# Patient Record
Sex: Female | Born: 1970 | Race: Black or African American | Hispanic: No | Marital: Single | Smoking: Never smoker
Health system: Southern US, Community
[De-identification: ages and names within clinical notes are randomized; demographics above are authoritative.]

## PROBLEM LIST (undated history)

## (undated) DIAGNOSIS — K219 Gastro-esophageal reflux disease without esophagitis: Secondary | ICD-10-CM

## (undated) DIAGNOSIS — R350 Frequency of micturition: Secondary | ICD-10-CM

## (undated) DIAGNOSIS — G610 Guillain-Barre syndrome: Secondary | ICD-10-CM

## (undated) DIAGNOSIS — K7581 Nonalcoholic steatohepatitis (NASH): Secondary | ICD-10-CM

## (undated) DIAGNOSIS — K589 Irritable bowel syndrome without diarrhea: Secondary | ICD-10-CM

## (undated) DIAGNOSIS — E785 Hyperlipidemia, unspecified: Secondary | ICD-10-CM

## (undated) DIAGNOSIS — IMO0002 Reserved for concepts with insufficient information to code with codable children: Secondary | ICD-10-CM

## (undated) DIAGNOSIS — E669 Obesity, unspecified: Secondary | ICD-10-CM

## (undated) DIAGNOSIS — F410 Panic disorder [episodic paroxysmal anxiety] without agoraphobia: Secondary | ICD-10-CM

## (undated) DIAGNOSIS — K5792 Diverticulitis of intestine, part unspecified, without perforation or abscess without bleeding: Secondary | ICD-10-CM

## (undated) DIAGNOSIS — G43709 Chronic migraine without aura, not intractable, without status migrainosus: Secondary | ICD-10-CM

## (undated) DIAGNOSIS — M1990 Unspecified osteoarthritis, unspecified site: Secondary | ICD-10-CM

## (undated) DIAGNOSIS — G43909 Migraine, unspecified, not intractable, without status migrainosus: Secondary | ICD-10-CM

## (undated) DIAGNOSIS — K760 Fatty (change of) liver, not elsewhere classified: Secondary | ICD-10-CM

## (undated) DIAGNOSIS — E119 Type 2 diabetes mellitus without complications: Secondary | ICD-10-CM

## (undated) DIAGNOSIS — R209 Unspecified disturbances of skin sensation: Principal | ICD-10-CM

## (undated) DIAGNOSIS — F329 Major depressive disorder, single episode, unspecified: Secondary | ICD-10-CM

## (undated) HISTORY — DX: Hyperlipidemia, unspecified: E78.5

## (undated) HISTORY — DX: Reserved for concepts with insufficient information to code with codable children: IMO0002

## (undated) HISTORY — DX: Fatty (change of) liver, not elsewhere classified: K76.0

## (undated) HISTORY — DX: Major depressive disorder, single episode, unspecified: F32.9

## (undated) HISTORY — DX: Irritable bowel syndrome, unspecified: K58.9

## (undated) HISTORY — DX: Guillain-Barre syndrome: G61.0

## (undated) HISTORY — DX: Obesity, unspecified: E66.9

## (undated) HISTORY — DX: Gastro-esophageal reflux disease without esophagitis: K21.9

## (undated) HISTORY — DX: Irritable bowel syndrome without diarrhea: K58.9

## (undated) HISTORY — DX: Migraine, unspecified, not intractable, without status migrainosus: G43.909

## (undated) HISTORY — DX: Type 2 diabetes mellitus without complications: E11.9

## (undated) HISTORY — DX: Unspecified disturbances of skin sensation: R20.9

## (undated) HISTORY — DX: Diverticulitis of intestine, part unspecified, without perforation or abscess without bleeding: K57.92

## (undated) HISTORY — DX: Chronic migraine without aura, not intractable, without status migrainosus: G43.709

## (undated) HISTORY — DX: Nonalcoholic steatohepatitis (NASH): K75.81

---

## 1993-09-03 DIAGNOSIS — G610 Guillain-Barre syndrome: Secondary | ICD-10-CM

## 1993-09-03 HISTORY — DX: Guillain-Barre syndrome: G61.0

## 1995-09-04 HISTORY — PX: ABDOMINAL HYSTERECTOMY: SHX81

## 1998-10-24 ENCOUNTER — Ambulatory Visit (HOSPITAL_COMMUNITY): Admission: RE | Admit: 1998-10-24 | Discharge: 1998-10-24 | Payer: Self-pay | Admitting: Obstetrics and Gynecology

## 1999-03-31 ENCOUNTER — Ambulatory Visit (HOSPITAL_COMMUNITY): Admission: RE | Admit: 1999-03-31 | Discharge: 1999-03-31 | Payer: Self-pay | Admitting: Urology

## 1999-10-11 ENCOUNTER — Other Ambulatory Visit: Admission: RE | Admit: 1999-10-11 | Discharge: 1999-10-11 | Payer: Self-pay | Admitting: Obstetrics and Gynecology

## 2000-10-23 ENCOUNTER — Other Ambulatory Visit: Admission: RE | Admit: 2000-10-23 | Discharge: 2000-10-23 | Payer: Self-pay | Admitting: *Deleted

## 2001-04-07 ENCOUNTER — Encounter (INDEPENDENT_AMBULATORY_CARE_PROVIDER_SITE_OTHER): Payer: Self-pay | Admitting: Specialist

## 2001-04-07 ENCOUNTER — Observation Stay (HOSPITAL_COMMUNITY): Admission: RE | Admit: 2001-04-07 | Discharge: 2001-04-08 | Payer: Self-pay | Admitting: Obstetrics and Gynecology

## 2005-09-03 HISTORY — PX: CHOLECYSTECTOMY: SHX55

## 2009-06-08 ENCOUNTER — Emergency Department (HOSPITAL_COMMUNITY): Admission: EM | Admit: 2009-06-08 | Discharge: 2009-06-08 | Payer: Self-pay | Admitting: Emergency Medicine

## 2009-09-14 ENCOUNTER — Emergency Department (HOSPITAL_COMMUNITY): Admission: EM | Admit: 2009-09-14 | Discharge: 2009-09-14 | Payer: Self-pay | Admitting: Emergency Medicine

## 2009-10-21 ENCOUNTER — Encounter: Admission: RE | Admit: 2009-10-21 | Discharge: 2009-10-21 | Payer: Self-pay | Admitting: Family Medicine

## 2010-09-23 ENCOUNTER — Other Ambulatory Visit: Payer: Self-pay | Admitting: Family Medicine

## 2010-09-23 DIAGNOSIS — Z1239 Encounter for other screening for malignant neoplasm of breast: Secondary | ICD-10-CM

## 2010-10-27 ENCOUNTER — Ambulatory Visit: Payer: Self-pay

## 2010-11-03 ENCOUNTER — Ambulatory Visit
Admission: RE | Admit: 2010-11-03 | Discharge: 2010-11-03 | Disposition: A | Discharge status: Home / Self Care | Payer: Managed Care, Other (non HMO) | Source: Ambulatory Visit | Attending: Family Medicine | Admitting: Family Medicine

## 2010-11-03 DIAGNOSIS — Z1239 Encounter for other screening for malignant neoplasm of breast: Secondary | ICD-10-CM

## 2010-12-07 LAB — PROTIME-INR
INR: 0.97 (ref 0.00–1.49)
Prothrombin Time: 12.8 seconds (ref 11.6–15.2)

## 2010-12-07 LAB — POCT I-STAT, CHEM 8
BUN: 15 mg/dL (ref 6–23)
Calcium, Ion: 1.21 mmol/L (ref 1.12–1.32)
Chloride: 105 mEq/L (ref 96–112)
Creatinine, Ser: 0.5 mg/dL (ref 0.4–1.2)
Glucose, Bld: 159 mg/dL — ABNORMAL HIGH (ref 70–99)
HCT: 41 % (ref 36.0–46.0)
Hemoglobin: 13.9 g/dL (ref 12.0–15.0)
Potassium: 3.2 mEq/L — ABNORMAL LOW (ref 3.5–5.1)
Sodium: 142 mEq/L (ref 135–145)
TCO2: 27 mmol/L (ref 0–100)

## 2010-12-07 LAB — DIFFERENTIAL
Basophils Absolute: 0 10*3/uL (ref 0.0–0.1)
Basophils Relative: 1 % (ref 0–1)
Eosinophils Absolute: 0.3 10*3/uL (ref 0.0–0.7)
Eosinophils Relative: 4 % (ref 0–5)
Lymphocytes Relative: 37 % (ref 12–46)
Lymphs Abs: 2.6 10*3/uL (ref 0.7–4.0)
Monocytes Absolute: 0.5 10*3/uL (ref 0.1–1.0)
Monocytes Relative: 8 % (ref 3–12)
Neutro Abs: 3.5 10*3/uL (ref 1.7–7.7)
Neutrophils Relative %: 50 % (ref 43–77)

## 2010-12-07 LAB — CBC
HCT: 38.5 % (ref 36.0–46.0)
Hemoglobin: 13.1 g/dL (ref 12.0–15.0)
MCHC: 34 g/dL (ref 30.0–36.0)
MCV: 86.7 fL (ref 78.0–100.0)
Platelets: 256 10*3/uL (ref 150–400)
RBC: 4.44 MIL/uL (ref 3.87–5.11)
RDW: 13.8 % (ref 11.5–15.5)
WBC: 7 10*3/uL (ref 4.0–10.5)

## 2010-12-07 LAB — POCT CARDIAC MARKERS
CKMB, poc: <1 ng/mL — ABNORMAL LOW (ref 1.0–8.0)
Myoglobin, poc: 52.8 ng/mL (ref 12–200)
Troponin i, poc: <0.05 ng/mL (ref 0.00–0.09)

## 2011-01-19 NOTE — H&P (Signed)
[facility]  Patient:    Dana Salazar, Dana Salazar                       MRN: [id number] Adm. Date:  04/07/01 Attending:  Nena Jordan A. Cherly Hensen, M.D.                         History and Physical  CHIEF COMPLAINT:              Severe dysmenorrhea/pelvic pain, uterine fibroids.  HISTORY OF PRESENT ILLNESS:   This is a 40 year old, gravida 1, para 0-0-1-0, single, black female on Lupron Depot therapy who has been admitted for LAVH/BSO secondary to severe dysmenorrhea unresponsive to hormonal therapy. The patient is known to have uterine fibroids by ultrasound. She has also has documented pelvic endometriosis by laparoscopy done in February of 2000. The patient has been placed on Lupron Depot therapy x two courses without marked improvement in her symptoms. She has also used birth control pills on a continuous basis in the past without change in her symptomatology. The patient has also had dyspareunia. She has used nonsteroidals as well as narcotics, which have also not helped with her pain as well. The patient wishes to proceed with definitive management of her symptoms. She has had some breakthrough bleeding despite the Lupron Depot therapy. Ultrasound done on October of 2001 revealed multiple small fibroids. The ovaries were normal at that time.  PAST MEDICAL HISTORY:  ALLERGIES:                    SULFA and FLAGYL.  MEDICAL HISTORY:              Pelvic endometriosis, uterine fibroids, bladder instability since age 5.  SURGICAL HISTORY:             Diagnostic laparoscopy with peritoneal biopsies on October 24, 1998. A wrist fracture in May of 2000. TAB by review of records in 1996.  OBSTETRICAL HISTORY:          Elective termination at six weeks in April of 1996.  FAMILY HISTORY:               Father died of an MI at age 36. Maternal grandmother also died of an MI. No breast, colon, or genital cancers.  REVIEW OF SYSTEMS:            History of  hypercholesterolemia followed by Dr. Demetrius Revel. Urinary tract infection November of 2001.  SOCIAL HISTORY:               Single in a relationship, nonsmoker.  PHYSICAL EXAMINATION:  GENERAL:                      Well-developed, well-nourished black female who is in some mild discomfort.  VITAL SIGNS:                  Blood pressure 120/80. Weight 198.4 pounds.  SKIN:                         No lesions.  HEENT:                        Anicteric sclerae, pink conjunctivae, oropharynx negative.  HEART:  Regular rate and rhythm without murmur.  LUNGS:                        Clear to auscultation.  BREASTS:                      Soft, nontender, no palpable mass.  ABDOMEN:                      Soft, nondistended. No organomegaly. Healed infraumbilical scar.  PELVIC:                       Vulva show no lesion. Vagina had no discharge. Cervix was closed, no lesions.  BIMANUAL EXAMINATION:         Uterus is anteverted, slightly enlarged, tender. Adnexa tender without any palpable mass.  RECTAL EXAMINATION:           Deferred.  IMPRESSION:                   Pelvic pain/severe dysmenorrhea recalcitrant to hormonal therapy, probably caused by both the uterine fibroids and pelvic endometriosis, and possible adenomyosis. The patient does not desire future fertility.  PLAN:                         LAVH/BSO. Antibiotics prophylaxis. Antiembolic stockings. Risks of the procedure have been discussed with the patient including, but not limited to, infection, bleeding, injury to surrounding organ structures such as the bladder, bowel, ureter, internal scar tissue which in the future may cause pelvic pain and a bowel obstruction, fistula formation. Loss of future childbearing option discussed. Menopausal issues discussed. Possible inability to complete the procedure as a vaginal hysterectomy also discussed. Bleeding which may require blood transfusion, risk of transfusion,  including acute reaction, HIV transmission of 1 out of 100,000, hepatitis transmission 1 out of 3000. Postoperative care and criteria for discharge was discussed. Estrogen replacement therapy, based on the patients age, was also discussed. The patient is in agreement with the use of estrogen replacement therapy for the protective benefits. All questions answered. DD:  04/07/01 TD:  04/07/01 Job: [id number]

## 2011-01-19 NOTE — Op Note (Signed)
[facility]  Patient:    Dana Salazar, Dana Salazar                     MRN: [id number] Proc. Date: 04/07/01 Adm. Date:  [id number] Attending:  Maxie Better                           Operative Report  PREOPERATIVE DIAGNOSES:       1. Severe dysmenorrhea.                               2. Pelvic endometriosis.                               3. Uterine fibroids.  POSTOPERATIVE DIAGNOSES:      1. Severe dysmenorrhea.                               2. Pelvic endometriosis.                               3. Uterine fibroids.  PROCEDURE:                    1. Laparoscopic assisted vaginal                                  hysterectomy.                               2. Bilateral salpingo-oophorectomy.  SURGEON:                      Sheronette A. Cherly Hensen, M.D.  ASSISTANT:                    Sung Amabile. Roslyn Smiling, M.D.  ANESTHESIA:                   General.  DESCRIPTION OF PROCEDURE:     Under adequate general anesthesia, the patient was placed in the dorsal lithotomy position. Examination under anesthesia revealed an axial small uterus. No adnexal masses could be appreciated. The patient was sterilely prepped and draped in usual fashion appropriate for a a laparoscopic assisted procedure. Bivalve speculum was placed in the vagina. Single-tooth tenaculum was placed on the anterior lip of the cervix. A Kohn cannula was introduced into the cervical os and attached to the tenaculum for manipulation of the uterus. An indwelling Foley catheter was sterilely placed. The bivalve speculum was removed. Attention was then turned to the abdomen where a small infraumbilical incision was then made. Veress needle was introduced and tested with normal saline. Opening pressure of 3 was noted. Two and a half liters of CO2 was insufflated. The Veress needle was removed and a 10/11 mm disposable trocar was introduced into the abdominal cavity without incident. A lighted video  laparoscope was placed through this port. Two lower port 5 mm incisions were then made midway between the symphysis pubis and the umbilicus in both lower quadrants and under direct visualization, 5 mm ports were introduced. Panoramic inspection of the pelvis was notable for  normal liver edge, uterus that was small with subserosal fibroids, normal tubes and ovaries were noted bilaterally. Endometriotic implants were not clearly seen; however, the patient has been on Lupron Depot therapy for six months. The vesicouterine peritoneum was then opened transversely. A small bleeder was noted and a grasper then used to tent the bladder flap, and using the reticulator, the bladder flap was opened transversely and sharply dissected off the lower uterine segment. The right round ligament was grasped and cauterization with the tripolar was initiated; however, the instrument was not fully cauterizing the round ligament. The Kleppinger was then used to cauterize the round ligament. Different instrument and replacement of the tripolar was done, as well as the generator was also replaced; however, the tripolar instruments were not functioning. Both round ligaments were subsequently grasped and cauterized using Kleppingers. The round ligament was subsequently severed bilaterally. Peristalsis was looked for with respect to the ureters bilaterally; however, they were not clearly seen initially. Nonetheless, the ovarian vessels were subsequently cauterized and cut bilaterally taking care to stay close to the ovary. When both tube and ovarian attachments had been severed and the level of the uterine vessels was reached, the decision was then made to turn to the vagina.  The posterior cul-de-sac was inspected. No endometriotic implants were noted clearly. Good hemostasis was noted. The pelvis was irrigated, small bleeders cauterized in the anterior cul-de-sac. The abdomen was deflated. The instruments and ports  remained through the incisions and attention was then turned to the vagina.  The instruments in the vagina were removed. A weighted speculum was placed in the vagina. The anteroposterior lip of the cervix was grasped with [place] tenaculum, and 1% lidocaine with 100,000 dilution of epinephrine was injected circumferentially at the cervicovaginal junction. A circumferential incision was then made at this junction, and using Mayo scissors, the posterior cul-de-sac was subsequently opened and this incision was extended transversely. The uterosacral ligaments were then bilaterally clamped, cut, and suture ligated with 0 Vicryl suture. The uterine vessels were then bilaterally clamped, cut, and suture ligated with 0 Vicryl sutures. Anteriorly, the bladder was then dissected off the uterus and subsequently the anterior cul-de-sac was entered. Continual clamping of the utero-ovarian ligaments resulted in the uterus being subsequently removed. The pedicles were then free tied with 0 Vicryl sutures. Both ovaries and tubes were in place, and they were brought down into the field, and their attachment to the lateral wall were clamped, cut, and free tied with 0 Vicryl x 2 bilaterally with subsequent removal of both tubes and ovaries. Small bleeding was noted on the left posterior aspect of the vaginal cuff and this was cauterized. The posterior cul-de-sac was ______ in order to prevent enterocele formation. The vagina was then closed in a vertical fashion using interrupted 0 Vicryl sutures and the uterosacral ligaments were then tied at the midline prior to closing the vagina entirely. The patient had decreased urine output, and the patient was given a fluid bolus.  Attention was then turned to the abdomen where the abdomen was re-insufflated. The lighted video laparoscope was placed. The Nezhat suction apparatus was in place. The pelvis was irrigated. Good hemostasis was noted. The ureters  were then looked at in their location and there was noted to be peristalsis bilaterally. The bladder was filled retrograde and no evidence of any defects in the bladder. With good hemostasis noted, the abdomen was deflated and the  lower ports removed prior to that, and the infraumbilical port was then removed.  The incision was then injected with 0.25% Marcaine and closed with 4-0 Vicryl sutures.  SPECIMENS:                    Uterus with cervix, tubes and ovaries bilaterally.  ESTIMATED BLOOD LOSS:         50 cc.  URINE OUTPUT:                 250 cc clear yellow urine.  INTRAOPERATIVE FLUID:         2800 cc.  COUNTS:                       Sponge and instrument count x 2 was correct.  COMPLICATIONS:                None.  DISPOSITION:                  The patient tolerated the procedure well and was transferred to recovery room in stable condition. DD:  04/07/01 TD:  04/08/01 Job: [id number]

## 2011-11-04 ENCOUNTER — Encounter (HOSPITAL_COMMUNITY): Payer: Self-pay | Admitting: Emergency Medicine

## 2011-11-04 ENCOUNTER — Emergency Department (HOSPITAL_COMMUNITY)
Admission: EM | Admit: 2011-11-04 | Discharge: 2011-11-04 | Disposition: A | Discharge status: Home Health | Payer: BC Managed Care – PPO | Attending: Emergency Medicine | Admitting: Emergency Medicine

## 2011-11-04 DIAGNOSIS — R21 Rash and other nonspecific skin eruption: Secondary | ICD-10-CM | POA: Insufficient documentation

## 2011-11-04 DIAGNOSIS — Z79899 Other long term (current) drug therapy: Secondary | ICD-10-CM | POA: Insufficient documentation

## 2011-11-04 DIAGNOSIS — N898 Other specified noninflammatory disorders of vagina: Secondary | ICD-10-CM | POA: Insufficient documentation

## 2011-11-04 DIAGNOSIS — I10 Essential (primary) hypertension: Secondary | ICD-10-CM

## 2011-11-04 DIAGNOSIS — B360 Pityriasis versicolor: Secondary | ICD-10-CM | POA: Insufficient documentation

## 2011-11-04 DIAGNOSIS — L298 Other pruritus: Secondary | ICD-10-CM | POA: Insufficient documentation

## 2011-11-04 DIAGNOSIS — L2989 Other pruritus: Secondary | ICD-10-CM | POA: Insufficient documentation

## 2011-11-04 MED ORDER — KETOCONAZOLE 2 % EX CREA: TOPICAL_CREAM | Freq: Every day | CUTANEOUS | Status: AC

## 2011-11-04 NOTE — ED Notes (Signed)
Pt. Stated, Im here to have my BP check and also I have a rash above my lt. Eye . Marland Kitchen I also have some type of bacteria infection I'm treated for in my vagina  For 2 months

## 2011-11-04 NOTE — ED Provider Notes (Signed)
History     CSN: [id number]  Arrival date & time 11/04/11  1042   First MD Initiated Contact with Patient 11/04/11 1109      Chief Complaint  Patient presents with  . Blood Pressure Check    (Consider location/radiation/quality/duration/timing/severity/associated sxs/prior treatment) HPI  Patient presents to emergency department requesting evaluation of her blood pressure, a one to two-week history of scaly patch of skin above her left eye, and also to discuss bacterial vaginosis in general. Patient states that she has been started on a blood pressure medicine by her primary care physician, Dr. Carolyne Fiscal, and has monitored her blood pressure for the last 1-2 months noticing waxing and waning blood pressures and states that last night she noted her blood pressure to be 160/100. She denies any headache, dizziness, chest pain, shortness of breath, abdominal pain, nausea, vomiting, diarrhea, changes in her urinary pattern. Patient also states she noticed gradual onset scaly patch of skin on her left eyelid with mild itching but denies drainage or redness of skin. She states she sees a dermatologist, Dr. Margo Aye but has not seen him for this complaint. She also wants to discuss bacterial vaginosis stating she was diagnosed with this roughly 2 months ago and is taking antibiotic x2 instills noticed a faint white vaginal discharge. However she denies any vaginal complaints. She denies vaginal pain, vaginal itching, pelvic pain, abdominal pain, dyspareunia, foul odor. She states awake in the morning and noticed a small amount of clear to white discharge in her underwear. She denies aggravating or alleviating factors symptoms. Patient has history of total hysterectomy.   No past medical history on file.  No past surgical history on file.  No family history on file.  History  Substance Use Topics  . Smoking status: Not on file  . Smokeless tobacco: Not on file  . Alcohol Use: Not on file    OB History      No data available      Review of Systems  All other systems reviewed and are negative.    Allergies  Flagyl and Sulfa antibiotics  Home Medications   Current Outpatient Rx  Name Route Sig Dispense Refill  . ALKA-SELTZER PLS ALLERGY & CGH PO Oral Take 1 capsule by mouth every 6 (six) hours as needed. Allergy/cold symptoms    . PROPRANOLOL HCL ER 80 MG PO CP24 Oral Take 80 mg by mouth daily.    Marland Kitchen KETOCONAZOLE 2 % EX CREA Topical Apply topically daily. Apply small amount of cream to patch of scaly skin daily x 2 weeks 15 g 0    BP 122/81  Pulse 96  Temp(Src) 98.3 F (36.8 C) (Oral)  Resp 18  SpO2 97%  Physical Exam  Nursing note and vitals reviewed. Constitutional: She is oriented to person, place, and time. She appears well-developed and well-nourished. No distress.  HENT:  Head: Normocephalic and atraumatic.  Eyes: Conjunctivae are normal.  Neck: Normal range of motion. Neck supple.  Cardiovascular: Normal rate, regular rhythm, normal heart sounds and intact distal pulses.  Exam reveals no gallop and no friction rub.   No murmur heard. Pulmonary/Chest: Effort normal and breath sounds normal. No respiratory distress. She has no wheezes. She has no rales. She exhibits no tenderness.  Abdominal: Soft. Bowel sounds are normal. She exhibits no distension and no mass. There is no tenderness. There is no rebound and no guarding.  Musculoskeletal: Normal range of motion. She exhibits no edema and no tenderness.  Neurological: She is  alert and oriented to person, place, and time.  Skin: Skin is warm and dry. Rash noted. She is not diaphoretic. No erythema.       Dime size area of fine scaly skin on left upper eyelid without erythema or drainage.  Psychiatric: She has a normal mood and affect.    ED Course  Procedures (including critical care time)  Labs Reviewed - No data to display No results found.   1. Tinea versicolor   2. Hypertension   3. Vaginal Discharge        MDM  Patient's blood pressure is normal here in the ER and discussed at length with patient the potential for waxing and waning blood pressures but that the overall average blood pressure should be well controlled and that she should followup with her primary care physician to discuss trends of blood pressure and further management. She has no other complaints suggestive of hypertensive urgency or emergency with a normal blood pressure and ER. Fine scaly patch of skin consistent with a tinea and will treat and have her followup with her dermatologist. Patient is describing a normal physiological vaginal discharge stating that she has not been sexually active in a year and has no vaginal complaints. She's been recently treated for bacterial vaginosis x2 and I do not think it is in her best interest or retreat given that she's asymptomatic other than what seems to be a physiological discharge denying abdominal pain or pelvic pain. Patient is agreeable to current treatment plan and the following up with providers as discussed.        Jenness Corner, [place] 11/04/11 1159

## 2011-11-06 NOTE — ED Provider Notes (Signed)
Medical screening examination/treatment/procedure(s) were performed by non-physician practitioner and as supervising physician I was immediately available for consultation/collaboration.  Cyndra Numbers, MD 11/06/11 1029

## 2011-11-08 ENCOUNTER — Other Ambulatory Visit: Payer: Self-pay | Admitting: Family Medicine

## 2011-11-08 DIAGNOSIS — Z1231 Encounter for screening mammogram for malignant neoplasm of breast: Secondary | ICD-10-CM

## 2011-11-30 ENCOUNTER — Ambulatory Visit
Admission: RE | Admit: 2011-11-30 | Discharge: 2011-11-30 | Disposition: A | Discharge status: Home / Self Care | Payer: BC Managed Care – PPO | Source: Ambulatory Visit | Attending: Family Medicine | Admitting: Family Medicine

## 2011-11-30 DIAGNOSIS — Z1231 Encounter for screening mammogram for malignant neoplasm of breast: Secondary | ICD-10-CM

## 2012-02-22 ENCOUNTER — Encounter (HOSPITAL_COMMUNITY): Payer: Self-pay | Admitting: Psychiatry

## 2012-02-22 ENCOUNTER — Ambulatory Visit (INDEPENDENT_AMBULATORY_CARE_PROVIDER_SITE_OTHER): Payer: BC Managed Care – PPO | Admitting: Psychiatry

## 2012-02-22 VITALS — BP 122/84 | HR 72 | Resp 12 | Wt 218.0 lb

## 2012-02-22 DIAGNOSIS — F411 Generalized anxiety disorder: Secondary | ICD-10-CM

## 2012-02-22 DIAGNOSIS — F329 Major depressive disorder, single episode, unspecified: Secondary | ICD-10-CM | POA: Insufficient documentation

## 2012-02-22 MED ORDER — VENLAFAXINE HCL ER 75 MG PO CP24: ORAL_CAPSULE | ORAL | Status: DC

## 2012-02-22 MED ORDER — LORAZEPAM 1 MG PO TABS: 1.0000 mg | ORAL_TABLET | Freq: Two times a day (BID) | ORAL | Status: DC

## 2012-02-22 NOTE — Progress Notes (Signed)
Psychiatric Assessment Adult  Patient Identification:  Dana Salazar Date of Evaluation:  02/22/2012 Chief Complaint: Depressed History of Chief Complaint:  No chief complaint on file.  this is a 41 year old African American single female who's coming to be evaluated for depression. Presently she is in one-to-one therapy for the last 2 years in [facility]. The patient describes 10 years of multiple family illness and death. This peaked in 08-25-12when the grandmother she been caring for for the last 10 years died. She died following a stroke 1 year ago. The patient has been a caregiver for her and also to her mother who she lives with. Her mother is chronically ill as well and recently his becoming forgetful. Noted in 2000 is that her boyfriend unexpectedly suicide. The patient has been on leave from at the for the last one year because of increasing depression. She's never been married, has no children and presently is not in a relationship. She describes persistent daily depression that has plateaued and and present for 10 years. I believe now she is seeking treatment because her disability insurance has insisted that she see a psychiatrist in addition to her therapist. She is therefore coming to improve the care she is receiving but also in response to her insurance company. She describes some disturbance in her sleeping. It is interrupted it she has no daytime dysfunction. She takes no naps and does not feel sleepy. He does have a significant increase in her appetite and has gained over 15 pounds in the last year. She claims her energy is pretty good but has great difficulties thinking and concentrating. She does describe persistent sense of worthlessness. She is some mild anhedonia but still is able to watch television. She used to be able to read and socialize and she doesn't seem to be able to do that at this time. She distinctly denies suicidal thinking and has never made an attempt. She denies  the use of alcohol or drugs, has never been psychotic and denies symptoms consistent with mania. She does describe chronic worrying that's present longer than 6 months, and go on for years and is associated with a reduction and concentration sleep disturbance and a sense that she's always must be worn ago. His bouts of being very irritable. She denies symptoms consistent with panic disorder or obsessive-compulsive disorder. The patient has just been restarted back on Effexor and says it does help in reducing her anxiety. She takes Ativan at night which was also recently started and has only mild benefits. This patient has no past psychiatric history.  HPI Review of Systems Physical Exam  Depressive Symptoms: depressed mood,  (Hypo) Manic Symptoms:   Elevated Mood:  No Irritable Mood:  No Grandiosity:  No Distractibility:  No Labiality of Mood:  No Delusions:  no Hallucinations:  No Impulsivity:  No Sexually Inappropriate Behavior:  No Financial Extravagance:  Yes Flight of Ideas:  No  Anxiety Symptoms: Excessive Worry:  No Panic Symptoms:  No Agoraphobia:  No Obsessive Compulsive: No  Symptoms: None, Specific Phobias:  No Social Anxiety:  No  Psychotic Symptoms:  Hallucinations: No None Delusions:  No Paranoia:  No   Ideas of Reference:  No  PTSD Symptoms: Ever had a traumatic exposure:  No Had a traumatic exposure in the last month:  No Re-experiencing: No None Hypervigilance:  No Hyperarousal: No None Avoidance: No None  Traumatic Brain Injury: No   Past Psychiatric History: Diagnosis: Depression  Hospitalizations: none  Outpatient Care:  yes in therapy  Substance Abuse Care: none  Self-Mutilation: none  Suicidal Attempts: none     Past Medical History:  No past medical history on file. History of Loss of Consciousness:  No Seizure History:  No Cardiac History:  No Allergies:   Allergies  Allergen Reactions  . Flagyl (Metronidazole Hcl) Other (See  Comments)    guillian barre syndrome  . Sulfa Antibiotics Rash   Current Medications:  Current Outpatient Prescriptions  Medication Sig Dispense Refill  . ketoconazole (NIZORAL) 2 % cream Apply topically daily. Apply small amount of cream to patch of scaly skin daily x 2 weeks  15 g  0  . Phenyleph-Doxylamine-DM-APAP (ALKA-SELTZER PLS ALLERGY & CGH PO) Take 1 capsule by mouth every 6 (six) hours as needed. Allergy/cold symptoms      . propranolol (INDERAL LA) 80 MG 24 hr capsule Take 80 mg by mouth daily.        Previous Psychotropic Medications:  Medication Dose                          Substance Abuse History in the last 12 months:NONE  Medical Consequences of Substance Abuse:   Legal Consequences of Substance Abuse:   Family Consequences of Substance Abuse:   Blackouts:  none DT's:  No Withdrawal Symptoms:  No None  Social History: Current Place of Residence: [place] of Birth:  Family Members:  Marital Status:  Single Children: none  Sons:   Daughters:  Relationships:  Education:  Corporate treasurer Problems/Performance:  Religious Beliefs/Practices:  History of Abuse: physical Teacher, music History:  None. Legal History: Hobbies/Interests:   Family History:  No family history on file.  Mental Status Examination/Evaluation: Objective:  Appearance: Fairly Groomed  Eye Contact::  Good  Speech:  Normal Rate  Volume:  Normal  Mood:  Depressed  Affect:  Congruent  Thought Process:  Coherent  Orientation:  Full  Thought Content:  WDL  Suicidal Thoughts:  No  Homicidal Thoughts:  No  Judgement:  Good  Insight:  Good  Psychomotor Activity:  Normal  Akathisia:  No  Handed:  Right  AIMS (if indicated):    Assets:  Desire for Improvement    Laboratory/X-Ray Psychological Evaluation(s)        Assessment:  Axis I: Generalized Anxiety Disorder  AXIS I Generalized Anxiety Disorder  AXIS II No diagnosis  AXIS III No past  medical history on file.   AXIS IV economic problems and problems related to social environment  AXIS V 61-70 mild symptoms   Treatment Plan/Recommendations:  Plan of Care: At this time this patient will increase her Effexor to taking 150 mg each morning. Will also increase her Ativan to 1 mg 2 at night. She she'll continue in one-to-one talking treatment in [facility]. Ideally this patient would also possibly go to group therapy but I do not think this is possible because she lives in [place] and it takes her 40 or 50 minutes just to come to therapy in [facility]. This patient to return to see me in 6 weeks.   Laboratory:    Psychotherapy: In [place]  Medications: Effexor, Ativan  Routine PRN Medications:  No  Consultations:   Safety Concerns:   Other:      Lucas Mallow, MD 6/21/20139:17 AM

## 2012-04-18 ENCOUNTER — Encounter (HOSPITAL_COMMUNITY): Payer: Self-pay | Admitting: Psychiatry

## 2012-04-18 ENCOUNTER — Ambulatory Visit (INDEPENDENT_AMBULATORY_CARE_PROVIDER_SITE_OTHER): Payer: BC Managed Care – PPO | Admitting: Psychiatry

## 2012-04-18 DIAGNOSIS — F411 Generalized anxiety disorder: Secondary | ICD-10-CM

## 2012-04-18 MED ORDER — VENLAFAXINE HCL ER 75 MG PO CP24: 225.0000 mg | ORAL_CAPSULE | ORAL | Status: DC

## 2012-04-18 NOTE — Progress Notes (Signed)
[facility] MD Progress Note  04/18/2012 11:28 AM  Diagnosis:  Axis I: Generalized Anxiety Disorder  ADL's:  Intact At this time the patient seems to be doingonly fairly well. She describes increasing irritability she does claim that her mood terms of depression is better ago. She is now sleeping better taking the Ativan. She also says that now she is able to read better. But she still was withdrawnpeer generally she is very frustrated with her environment. It seems to be generalized and not to any specific cause. She does feel pressure from [employer] and that they want a specific date when she'll feel well. I am not clear when that is. Generally her appetite is better and her energy seems to be pretty good. She continues to have some degree of problems concentrating. I shared with her that I believe her condition generalized anxiety disorder has a significant component of irritability. Overall her mood is and is better but her irritability I will go ahead therefore and increase her Effexor to a 75 mg pill taking 3 in the morning. At this time should continue taking the Ativan as ordered. Sleep: Good  Appetite:  Good  Suicidal Ideation: none none Homicidal Ideation:none     AEB (as evidenced by):  Mental Status Examination/Evaluation: Objective:  Appearance: Casual  Eye Contact::  Good  Speech:  Normal Rate  Volume:  Normal  Mood:  Depressed  Affect:  Congruent  Thought Process:  Coherent  Orientation:  Full  Thought Content:  WDL  Suicidal Thoughts:  No  Homicidal Thoughts:  No  Memory:  normal  Judgement:  Good  Insight:  Good  Psychomotor Activity:  Normal and EPS  Concentration:  Good  Recall:  Good  Akathisia:  No  Handed:  Right  AIMS (if indicated):     Assets:  Desire for Improvement  Sleep:      Vital Signs:There were no vitals taken for this visit. Current Medications: Current Outpatient Prescriptions  Medication Sig Dispense Refill  . ketoconazole  (NIZORAL) 2 % cream Apply topically daily. Apply small amount of cream to patch of scaly skin daily x 2 weeks  15 g  0  . LORazepam (ATIVAN) 1 MG tablet Take 1 tablet (1 mg total) by mouth 2 (two) times daily.  60 tablet  3  . Phenyleph-Doxylamine-DM-APAP (ALKA-SELTZER PLS ALLERGY & [facility] PO) Take 1 capsule by mouth every 6 (six) hours as needed. Allergy/cold symptoms      . propranolol (INDERAL LA) 80 MG 24 hr capsule Take 80 mg by mouth daily.      Marland Kitchen venlafaxine XR (EFFEXOR XR) 75 MG 24 hr capsule 75mg  XR 2 qam  60 capsule  5    Lab Results: No results found for this or any previous visit (from the past 48 hour(s)).  Physical Findings: AIMS:  , ,  ,  ,    CIWA:    COWS:     Treatment Plan Summary:   Plan:this patient she'll return to see me in 6 weeks. But for the time being she'll increase her Effexor to 225 mg in the morning and continue her Ativan 2 mg at night. She she'll also continue in her talking therapy in [facility]. When her next visit we shall discuss exactly what was happening at the time that she dropped out of work and became "disability". He clearly was related to the fact that she been caring for both her grandmother and her mother and her grandmother finally  had died. Now her issues seem to be mainly related to her mother. She seems to be the caregiver for everybody but herself.  Dana Salazar 04/18/2012, 11:28 AM

## 2012-05-28 ENCOUNTER — Encounter (HOSPITAL_COMMUNITY): Payer: Self-pay | Admitting: Psychiatry

## 2012-05-28 ENCOUNTER — Ambulatory Visit (INDEPENDENT_AMBULATORY_CARE_PROVIDER_SITE_OTHER): Payer: BC Managed Care – PPO | Admitting: Psychiatry

## 2012-05-28 VITALS — BP 108/82 | HR 78 | Wt 218.6 lb

## 2012-05-28 DIAGNOSIS — F4322 Adjustment disorder with anxiety: Secondary | ICD-10-CM

## 2012-05-28 NOTE — Progress Notes (Signed)
[facility] MD Progress Note  05/28/2012 3:06 PM  Diagnosis:  Axis I: Adjustment Disorder with Anxiety This patient appeared on time today. Initially was believed that she needed a disability form filled out but she claims she does not. She claims her therapist has done that. Up until August 21 the patient actually was doing quite well. Her anxiety level is lower. She was sleeping better. The Ativan was very helpful. Still nonetheless she was very withdrawn and lipid very sedentary lifestyle. She generally just takes care of her mother. Unfortunately in the last one month she's had for deaths. 2 of them were sudden deaths in young people including a woman who is about to deliver a baby. 2 will or persons that she was close to but somewhat expected deaths. Since then she's excelled extremely distressed and anxious. It is noted that she has significant medical problems at this time. She's receiving 3 medications for her migraine headaches and 3 medications for GI symptoms. She appointment with her gastroenterologist in a week. She herself acknowledges that likely these conditions are either initiated or come from excessive anxiety that she cannot explain. We reviewed her work history and was evident that over the last few years she did in out of work on disability. Recently she was taken out of March 2013. Few months before that she was working fairly well but felt extreme anxiety. In retrospect she gouges that were places become more intense more than she could probably handle. She's requested a different job from her employer but they refused according to her. 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 Initially she started having significant problems around the death of her grandmother in the illness of her mother. A close evaluation I do not believe that this patient has generalized anxiety disorder. Her biggest distinct stresses is her own health, her mothers health and finances. She claims that  all 3 are removed she has a few worries. What is more consistent and indicative of generalized anxiety disorder as a person worries about everything. The patient does describe that she has chronic anxiety and is on the go all the time. She doesn't really have muscle skeletal pain but does have somatic complaints. For a good while there her sleep is much better on Ativan. She denies fatigue. At this time it is evident that she's going through somewhat of her grieving process. :  Intact  Sleep: Good  Appetite:  Good  Suicidal Ideation:   Homicidal Ideation:    AEB (as evidenced by):  Mental Status Examination/Evaluation: Objective:  Appearance: Casual  Eye Contact::  Good  Speech:  Clear and Coherent  Volume:  Normal  Mood:  Anxious  Affect:  Appropriate  Thought Process:  Goal Directed  Orientation:  Full  Thought Content:  WDL  Suicidal Thoughts:  No  Homicidal Thoughts:  No  Memory:  normal  Judgement:  Good  Insight:  Good  Psychomotor Activity:  Normal  Concentration:  Good  Recall:  Good  Akathisia:  No  Handed:  Right  AIMS (if indicated):     Assets:  Communication Skills  Sleep:      Vital Signs:Blood pressure 108/82, pulse 78, weight 218 lb 9.6 oz (99.156 kg). Current Medications: Current Outpatient Prescriptions  Medication Sig Dispense Refill  . ketoconazole (NIZORAL) 2 % cream Apply topically daily. Apply small amount of cream to patch of scaly skin daily x 2 weeks  15 g  0  . LORazepam (ATIVAN) 1 MG  tablet Take 1 tablet (1 mg total) by mouth 2 (two) times daily.  60 tablet  3  . Phenyleph-Doxylamine-DM-APAP (ALKA-SELTZER PLS ALLERGY & [facility] PO) Take 1 capsule by mouth every 6 (six) hours as needed. Allergy/cold symptoms      . propranolol (INDERAL LA) 80 MG 24 hr capsule Take 80 mg by mouth daily.      Marland Kitchen venlafaxine XR (EFFEXOR XR) 75 MG 24 hr capsule Take 3 capsules (225 mg total) by mouth as directed. 75mg  XR 2 qam  90 capsule  5    Lab Results: No results  found for this or any previous visit (from the past 48 hour(s)).  Physical Findings: AIMS:  , ,  ,  ,    CIWA:    COWS:     Treatment Plan Summary:at this time the patient she'll continue taking Ativan 1 mg twice a day and Effexor 225 mg in the morning. She'll continue in her therapy with Mrs. Advice worker in [employer]. She'll continue in medical treatment with Dr. Zachery Conch her neurologist and also her gastroenterologist. If her condition I suspect there is a distinct interaction between her anxiety and her physical complaints. It is evident that she is unable to work. She claims this is just as much the fact that she has to go to the bathroom every hour or 2 with diarrhea. She clearly also has significant pain but her headaches as well as her GI system. This patient return to see me in 2 months. Medication management  Plan:  Lucas Mallow 05/28/2012, 3:06 PM

## 2012-06-11 ENCOUNTER — Ambulatory Visit (HOSPITAL_COMMUNITY): Payer: Self-pay | Admitting: Psychiatry

## 2012-08-06 ENCOUNTER — Ambulatory Visit (HOSPITAL_COMMUNITY): Payer: Self-pay | Admitting: Psychiatry

## 2012-11-28 ENCOUNTER — Encounter (HOSPITAL_COMMUNITY): Payer: Self-pay | Admitting: Emergency Medicine

## 2012-11-28 ENCOUNTER — Emergency Department (HOSPITAL_COMMUNITY)
Admission: EM | Admit: 2012-11-28 | Discharge: 2012-11-28 | Disposition: A | Discharge status: Home / Self Care | Payer: BC Managed Care – PPO | Attending: Emergency Medicine | Admitting: Emergency Medicine

## 2012-11-28 DIAGNOSIS — R300 Dysuria: Secondary | ICD-10-CM | POA: Insufficient documentation

## 2012-11-28 DIAGNOSIS — R350 Frequency of micturition: Secondary | ICD-10-CM | POA: Insufficient documentation

## 2012-11-28 DIAGNOSIS — Z90710 Acquired absence of both cervix and uterus: Secondary | ICD-10-CM | POA: Insufficient documentation

## 2012-11-28 DIAGNOSIS — Z79899 Other long term (current) drug therapy: Secondary | ICD-10-CM | POA: Insufficient documentation

## 2012-11-28 DIAGNOSIS — B3731 Acute candidiasis of vulva and vagina: Secondary | ICD-10-CM | POA: Insufficient documentation

## 2012-11-28 DIAGNOSIS — M67431 Ganglion, right wrist: Secondary | ICD-10-CM

## 2012-11-28 DIAGNOSIS — B373 Candidiasis of vulva and vagina: Secondary | ICD-10-CM

## 2012-11-28 DIAGNOSIS — M6740 Ganglion, unspecified site: Secondary | ICD-10-CM | POA: Insufficient documentation

## 2012-11-28 LAB — URINALYSIS, ROUTINE W REFLEX MICROSCOPIC
Bilirubin Urine: NEGATIVE
Glucose, UA: NEGATIVE mg/dL
Ketones, ur: NEGATIVE mg/dL
Leukocytes, UA: NEGATIVE
Nitrite: NEGATIVE
Protein, ur: NEGATIVE mg/dL
Specific Gravity, Urine: >1.03 — ABNORMAL HIGH (ref 1.005–1.030)
Urobilinogen, UA: 0.2 mg/dL (ref 0.0–1.0)
pH: 5.5 (ref 5.0–8.0)

## 2012-11-28 LAB — WET PREP, GENITAL
Clue Cells Wet Prep HPF POC: NONE SEEN
Trich, Wet Prep: NONE SEEN

## 2012-11-28 LAB — URINE MICROSCOPIC-ADD ON

## 2012-11-28 MED ORDER — HYDROCODONE-ACETAMINOPHEN 5-325 MG PO TABS: 1.0000 | ORAL_TABLET | Freq: Three times a day (TID) | ORAL | Status: DC | PRN

## 2012-11-28 MED ORDER — NYSTATIN 100000 UNIT/GM EX CREA: TOPICAL_CREAM | CUTANEOUS | Status: DC

## 2012-11-28 NOTE — ED Provider Notes (Signed)
Medical screening examination/treatment/procedure(s) were performed by non-physician practitioner and as supervising physician I was immediately available for consultation/collaboration.   Charles B. Sheldon, MD 11/28/12 1245 

## 2012-11-28 NOTE — ED Provider Notes (Signed)
History     CSN: [id number]  Arrival date & time 11/28/12  4098   First MD Initiated Contact with Patient 11/28/12 [phone]      No chief complaint on file.   (Consider location/radiation/quality/duration/timing/severity/associated sxs/prior treatment) HPI   42 year old female presents with multiple complaints. Patient primary complaint is pain to the right wrist. Patient notice any nodule on the dorsum of the right wrist which appears 3 days ago. Nausea has been persistent in size, with associated burning sensation worsening with movement of her wrist. She denies any specific trauma but states she did broke the same wrist many years ago.  No associated rash, or fever.  No weakness or numbness. She denies any joint pain. No specific treatment tried.  Patient also reports having dysuria with urinary urgency frequency for the past week, which felt similar to prior UTI. She has a  hysterectomy. She denies any hematuria or flank pain. Reports mild vaginal discharge.  No past medical history on file.  No past surgical history on file.  No family history on file.  History  Substance Use Topics  . Smoking status: Never Smoker   . Smokeless tobacco: Not on file  . Alcohol Use: Not on file    OB History   Grav Para Term Preterm Abortions TAB SAB Ect Mult Living                  Review of Systems  Constitutional: Negative for fever.  Gastrointestinal: Negative for abdominal pain.  Genitourinary: Positive for dysuria. Negative for hematuria.  Skin: Negative for rash and wound.  All other systems reviewed and are negative.    Allergies  Flagyl and Sulfa antibiotics  Home Medications   Current Outpatient Rx  Name  Route  Sig  Dispense  Refill  . LORazepam (ATIVAN) 1 MG tablet   Oral   Take 1 tablet (1 mg total) by mouth 2 (two) times daily.   60 tablet   3   . Phenyleph-Doxylamine-DM-APAP (ALKA-SELTZER PLS ALLERGY & [facility] PO)   Oral   Take 1 capsule by mouth every 6 (six)  hours as needed. Allergy/cold symptoms         . propranolol (INDERAL LA) 80 MG 24 hr capsule   Oral   Take 80 mg by mouth daily.         Marland Kitchen venlafaxine XR (EFFEXOR XR) 75 MG 24 hr capsule   Oral   Take 3 capsules (225 mg total) by mouth as directed. 75mg  XR 2 qam   90 capsule   5     There were no vitals taken for this visit.  Physical Exam  Nursing note and vitals reviewed. Constitutional: She appears well-developed and well-nourished. No distress.  HENT:  Head: Atraumatic.  Eyes: Conjunctivae are normal.  Abdominal: Soft. There is no tenderness.  Musculoskeletal: She exhibits tenderness (R wrist: a 1cm mobile nodule noted to dorsum of wrist consistent with ganglion cyst.  No rash.  radial pulse 2+).  Neurological: She is alert.  Skin: Skin is warm. No rash noted.    ED Course  Procedures (including critical care time)  10:08 AM Pt presents with a nodule to dorsum of R wrist consistent with ganglion cyst.  Doubt abscess, neoplasm. No joint involvement, no rash.  Will provide f/u with [facility] for further management.    Pt also c/o dysuria x 1 week.  Will check UA.  Pt otherwise in NAD, VSS, afebrile.    11:58 AM Pt has  yeast infection, vaginal candidiasis.  Since pt has allergy to flagyl, will provide nystatin cream as treatment.  Pt reports she is not sexually active with last sexual activity in 2012, therefore, low suspicion for GC/Ch.  Culture sent.    Labs Reviewed  WET PREP, GENITAL - Abnormal; Notable for the following:    Yeast Wet Prep HPF POC FEW (*)    WBC, Wet Prep HPF POC MODERATE (*)    All other components within normal limits  URINALYSIS, ROUTINE W REFLEX MICROSCOPIC - Abnormal; Notable for the following:    APPearance CLOUDY (*)    Specific Gravity, Urine >1.030 (*)    Hgb urine dipstick MODERATE (*)    All other components within normal limits  URINE MICROSCOPIC-ADD ON - Abnormal; Notable for the following:    Squamous Epithelial / LPF MANY (*)     Bacteria, UA MANY (*)    All other components within normal limits  GC/CHLAMYDIA PROBE AMP   No results found.   1. Ganglion cyst of wrist, right   2. Vaginal candidiasis       MDM  BP 126/84  Pulse 84  Temp(Src) 97 F (36.1 C) (Oral)  SpO2 99%         Fayrene Helper, PA-C 11/28/12 1159

## 2012-11-28 NOTE — ED Notes (Signed)
C/O right wrist pain w/ nodule noted on wrist. Also c/o urinary frequency and vaginal discharge.

## 2012-11-29 LAB — GC/CHLAMYDIA PROBE AMP
CT Probe RNA: NEGATIVE
GC Probe RNA: NEGATIVE

## 2013-09-18 ENCOUNTER — Encounter: Payer: Self-pay | Admitting: Neurology

## 2013-09-21 ENCOUNTER — Encounter (INDEPENDENT_AMBULATORY_CARE_PROVIDER_SITE_OTHER): Payer: Self-pay

## 2013-09-21 ENCOUNTER — Encounter: Payer: Self-pay | Admitting: Neurology

## 2013-09-21 ENCOUNTER — Ambulatory Visit (INDEPENDENT_AMBULATORY_CARE_PROVIDER_SITE_OTHER): Payer: BC Managed Care – PPO | Admitting: Neurology

## 2013-09-21 VITALS — BP 134/86 | HR 81 | Ht 63.0 in | Wt 213.0 lb

## 2013-09-21 DIAGNOSIS — R209 Unspecified disturbances of skin sensation: Secondary | ICD-10-CM

## 2013-09-21 HISTORY — DX: Unspecified disturbances of skin sensation: R20.9

## 2013-09-21 NOTE — Progress Notes (Signed)
Reason for visit: Numbness  Dana Salazar is a 43 y.o. 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 The patient indicates that since that time, she has had occasional intermittent episodes of leg and arm numbness and tingling that have occurred lasting one or 2 days and then clearing. The patient had similar problems that began 3 weeks ago, but the symptoms have been persistent, which is unusual for her. The patient has some numbness in the bottom of the feet, and in the anterior thigh on the left, lateral thigh on the right. The patient is a heaviness sensation and some numbness involving the right arm. The patient has some discomfort around the left eye. The patient denies any numbness on the head or neck. The patient has not had any problems controlling the bowels or the bladder, and she denies any balance issues. The patient does have a history of migraine headaches that occurs on average 3 times a week. The patient is [facility] through [facility]. The patient denies any visual disturbance, with no double vision or loss of vision. The patient denies any speech or swallowing issues.  Past Medical History  Diagnosis Date  . Guillain-Barre syndrome   . Anxiety and depression   . IBS (irritable bowel syndrome)   . Chronic migraine   . Diverticulitis   . Hyperlipidemia   . GERD (gastroesophageal reflux disease)   . Disturbance of skin sensation 09/21/2013  . Obesity     Past Surgical History  Procedure Laterality Date  . Abdominal hysterectomy  1997  . Cholecystectomy  2007    Family History  Problem Relation Age of Onset  . Heart attack Father   . Bipolar disorder Sister   . Neurofibromatosis Paternal Aunt   . Dementia Maternal Grandmother     Social history:  reports that she has never smoked. She has never used smokeless tobacco. She reports  that she does not drink alcohol or use illicit drugs.  Medications:  Current Outpatient Prescriptions on File Prior to Visit  Medication Sig Dispense Refill  . dicyclomine (BENTYL) 10 MG capsule Take 10 mg by mouth 2 (two) times daily.      Marland Kitchen. HYDROcodone-acetaminophen (NORCO/VICODIN) 5-325 MG per tablet Take 1 tablet by mouth every 8 (eight) hours as needed for pain.  10 tablet  0  . LORazepam (ATIVAN) 1 MG tablet Take 1 mg by mouth at bedtime.      . mometasone (NASONEX) 50 MCG/ACT nasal spray Place 2 sprays into the nose daily.      . naproxen sodium (ANAPROX) 220 MG tablet Take 440 mg by mouth 2 (two) times daily as needed (for headache).      . nystatin cream (MYCOSTATIN) Apply to vagina  Daily for 7 days  30 g  0  . omeprazole (PRILOSEC) 40 MG capsule Take 40 mg by mouth 2 (two) times daily.      Marland Kitchen. tiZANidine (ZANAFLEX) 4 MG tablet Take 4 mg by mouth 2 (two) times daily as needed (for migraine).      . venlafaxine XR (EFFEXOR-XR) 75 MG 24 hr capsule Take 75 mg by mouth daily.       No current facility-administered medications on file prior to visit.      Allergies  Allergen Reactions  . Flagyl [Metronidazole Hcl] Other (See Comments)    guillian barre syndrome  . Sulfa  Antibiotics Rash    ROS:  Out of a complete 14 system review of symptoms, the patient complains only of the following symptoms, and all other reviewed systems are negative.  Fatigue Eyes pain, left Urination problems, blood in the urine Joint pain, joint swelling, achy muscles Allergies Headache, numbness, weakness Depression, anxiety, insomnia, decreased energy, disinterest in activities, racing thoughts Snoring  Blood pressure 134/86, pulse 81, height 5\' 3"  (1.6 m), weight 213 lb (96.616 kg).  Physical Exam  General: The patient is alert and cooperative at the time of the examination. The patient is moderately to markedly obese.  Eyes: Pupils are equal, round, and reactive to light. Discs are flat  bilaterally.  Neck: The neck is supple, no carotid bruits are noted.  Respiratory: The respiratory examination is clear.  Cardiovascular: The cardiovascular examination reveals a regular rate and rhythm, no obvious murmurs or rubs are noted.  Skin: Extremities are without significant edema.  Neurologic Exam  Mental status: The patient is alert and oriented x 3 at the time of the examination. The patient has apparent normal recent and remote memory, with an apparently normal attention span and concentration ability.  Cranial nerves: Facial symmetry is present. There is good sensation of the face to pinprick and soft touch bilaterally. The strength of the facial muscles and the muscles to head turning and shoulder shrug are normal bilaterally. Speech is well enunciated, no aphasia or dysarthria is noted. Extraocular movements are full. Visual fields are full. The tongue is midline, and the patient has symmetric elevation of the soft palate. No obvious hearing deficits are noted.  Motor: The motor testing reveals 5 over 5 strength of all 4 extremities. Good symmetric motor tone is noted throughout.  Sensory: Sensory testing is intact to pinprick, soft touch, vibration sensation, and position sense on all 4 extremities, With exception that there is a stocking pattern pinprick sensory deficit one half way up the legs below the knees. No evidence of extinction is noted.  Coordination: Cerebellar testing reveals good finger-nose-finger and heel-to-shin bilaterally.  Gait and station: Gait is normal. Tandem gait is normal. Romberg is negative. No drift is seen.  Reflexes: Deep tendon reflexes are symmetric and normal bilaterally. 2-3 beats of ankle clonus are seen bilaterally.  Toes are downgoing bilaterally.   Assessment/Plan:  1. Sensory alteration, legs and right arm  2. History depression and anxiety  3. History of migraine headache  The patient is having some problems with persistent  sensory alteration in the legs and the right arm. The patient will be evaluated for this issue. The patient will undergo blood work today, and MRI evaluation of the brain and cervical spine to evaluate for possible demyelinating disease. The patient will followup if needed.  Marlan Palau MD 09/21/2013 7:16 PM  [facility] [address] [place], [place] [address]  Phone [phone] Fax [phone]

## 2013-09-23 LAB — HIV ANTIBODY (ROUTINE TESTING W REFLEX)
HIV 1/O/2 Abs-Index Value: <1 (ref <1.00)
HIV-1/HIV-2 Ab: NONREACTIVE

## 2013-09-23 LAB — TSH: TSH: 1.16 u[IU]/mL (ref 0.450–4.500)

## 2013-09-23 LAB — RPR: RPR: NONREACTIVE

## 2013-09-23 LAB — ANA W/REFLEX: Anti Nuclear Antibody(ANA): NEGATIVE

## 2013-09-23 LAB — LYME, TOTAL AB TEST/REFLEX: Lyme IgG/IgM Ab: <0.91 {ISR} (ref 0.00–0.90)

## 2013-09-23 LAB — ANGIOTENSIN CONVERTING ENZYME: Angio Convert Enzyme: 71 U/L (ref 14–82)

## 2013-09-23 LAB — VITAMIN B12: Vitamin B-12: 530 pg/mL (ref 211–946)

## 2013-09-23 LAB — COPPER, SERUM: Copper: 86 ug/dL (ref 72–166)

## 2013-09-23 NOTE — Progress Notes (Signed)
Quick Note:  Shared unremarkable labs with patient , verbalized understanding ______

## 2013-09-30 ENCOUNTER — Other Ambulatory Visit: Payer: BC Managed Care – PPO

## 2013-11-19 ENCOUNTER — Other Ambulatory Visit: Payer: BC Managed Care – PPO

## 2013-11-30 ENCOUNTER — Emergency Department (HOSPITAL_COMMUNITY)
Admission: EM | Admit: 2013-11-30 | Discharge: 2013-11-30 | Disposition: A | Discharge status: Home / Self Care | Payer: BC Managed Care – PPO | Attending: Emergency Medicine | Admitting: Emergency Medicine

## 2013-11-30 ENCOUNTER — Encounter (HOSPITAL_COMMUNITY): Payer: Self-pay | Admitting: Emergency Medicine

## 2013-11-30 DIAGNOSIS — F411 Generalized anxiety disorder: Secondary | ICD-10-CM | POA: Insufficient documentation

## 2013-11-30 DIAGNOSIS — G43909 Migraine, unspecified, not intractable, without status migrainosus: Secondary | ICD-10-CM

## 2013-11-30 DIAGNOSIS — Z79899 Other long term (current) drug therapy: Secondary | ICD-10-CM | POA: Insufficient documentation

## 2013-11-30 DIAGNOSIS — Z3202 Encounter for pregnancy test, result negative: Secondary | ICD-10-CM | POA: Insufficient documentation

## 2013-11-30 DIAGNOSIS — E669 Obesity, unspecified: Secondary | ICD-10-CM | POA: Insufficient documentation

## 2013-11-30 DIAGNOSIS — F329 Major depressive disorder, single episode, unspecified: Secondary | ICD-10-CM | POA: Insufficient documentation

## 2013-11-30 DIAGNOSIS — R002 Palpitations: Secondary | ICD-10-CM | POA: Insufficient documentation

## 2013-11-30 DIAGNOSIS — F3289 Other specified depressive episodes: Secondary | ICD-10-CM | POA: Insufficient documentation

## 2013-11-30 DIAGNOSIS — E785 Hyperlipidemia, unspecified: Secondary | ICD-10-CM | POA: Insufficient documentation

## 2013-11-30 DIAGNOSIS — Z8669 Personal history of other diseases of the nervous system and sense organs: Secondary | ICD-10-CM | POA: Insufficient documentation

## 2013-11-30 DIAGNOSIS — F419 Anxiety disorder, unspecified: Secondary | ICD-10-CM

## 2013-11-30 DIAGNOSIS — K219 Gastro-esophageal reflux disease without esophagitis: Secondary | ICD-10-CM | POA: Insufficient documentation

## 2013-11-30 HISTORY — DX: Panic disorder (episodic paroxysmal anxiety): F41.0

## 2013-11-30 LAB — CBC
HCT: 41.5 % (ref 36.0–46.0)
Hemoglobin: 14.1 g/dL (ref 12.0–15.0)
MCH: 30 pg (ref 26.0–34.0)
MCHC: 34 g/dL (ref 30.0–36.0)
MCV: 88.3 fL (ref 78.0–100.0)
Platelets: 277 10*3/uL (ref 150–400)
RBC: 4.7 MIL/uL (ref 3.87–5.11)
RDW: 14.1 % (ref 11.5–15.5)
WBC: 6.9 10*3/uL (ref 4.0–10.5)

## 2013-11-30 LAB — BASIC METABOLIC PANEL
BUN: 12 mg/dL (ref 6–23)
CO2: 25 mEq/L (ref 19–32)
Calcium: 9.4 mg/dL (ref 8.4–10.5)
Chloride: 104 mEq/L (ref 96–112)
Creatinine, Ser: 0.58 mg/dL (ref 0.50–1.10)
GFR calc Af Amer: >90 mL/min (ref >90)
GFR calc non Af Amer: >90 mL/min (ref >90)
Glucose, Bld: 149 mg/dL — ABNORMAL HIGH (ref 70–99)
Potassium: 3.6 mEq/L — ABNORMAL LOW (ref 3.7–5.3)
Sodium: 143 mEq/L (ref 137–147)

## 2013-11-30 LAB — POC URINE PREG, ED: Preg Test, Ur: NEGATIVE

## 2013-11-30 MED ORDER — METOCLOPRAMIDE HCL 5 MG/ML IJ SOLN
10.0000 mg | Freq: Once | INTRAMUSCULAR | Status: AC
  Administered 2013-11-30: 10 mg via INTRAVENOUS
  Filled 2013-11-30: qty 2

## 2013-11-30 MED ORDER — DIPHENHYDRAMINE HCL 50 MG/ML IJ SOLN
25.0000 mg | Freq: Once | INTRAMUSCULAR | Status: AC
  Administered 2013-11-30: 25 mg via INTRAVENOUS
  Filled 2013-11-30: qty 1

## 2013-11-30 MED ORDER — SODIUM CHLORIDE 0.9 % IV BOLUS (SEPSIS)
1000.0000 mL | Freq: Once | INTRAVENOUS | Status: AC
  Administered 2013-11-30: 1000 mL via INTRAVENOUS

## 2013-11-30 NOTE — ED Provider Notes (Signed)
CSN: [id number]     Arrival date & time 11/30/13  0919 History   First MD Initiated Contact with Patient 11/30/13 [phone]     Chief Complaint  Patient presents with  . Panic Attack     (Consider location/radiation/quality/duration/timing/severity/associated sxs/prior Treatment) HPI 43 year old female presents with 3 days of a migraine headache. It is a throbbing, 9/10 left sided headache. Mild photophobia, no visual changes. Nausea, no vomiting. No fevers or neck stiffness. No weakness. She has tried [employer] powders without success. This feels just like her normal migraines. She gets several per month. She used to get botox injections to help but has stopped these due to unavailability. She has chronic depression and anxiety/panic attacks. This has been for past several months, worse over these few days due to lack of sleep. Denies SI/HI. States she gets palpitations as she starts to fall asleep, which re-wakes her up. Feels lack of sleep is significantly contributing to her anxiety/panic attacks. Denies dyspnea or chest pain.  Past Medical History  Diagnosis Date  . Guillain-Barre syndrome   . Anxiety and depression   . IBS (irritable bowel syndrome)   . Chronic migraine   . Diverticulitis   . Hyperlipidemia   . GERD (gastroesophageal reflux disease)   . Disturbance of skin sensation 09/21/2013  . Obesity   . Panic attack    Past Surgical History  Procedure Laterality Date  . Abdominal hysterectomy  1997  . Cholecystectomy  2007   Family History  Problem Relation Age of Onset  . Heart attack Father   . Bipolar disorder Sister   . Neurofibromatosis Paternal Aunt   . Dementia Maternal Grandmother    History  Substance Use Topics  . Smoking status: Never Smoker   . Smokeless tobacco: Never Used  . Alcohol Use: No   OB History   Grav Para Term Preterm Abortions TAB SAB Ect Mult Living                 Review of Systems  Constitutional: Negative for fever.  Eyes: Positive for  photophobia. Negative for visual disturbance.  Respiratory: Negative for cough and shortness of breath.   Cardiovascular: Positive for palpitations. Negative for chest pain.  Gastrointestinal: Positive for nausea. Negative for vomiting and abdominal pain.  Genitourinary: Negative for dysuria.  Musculoskeletal: Negative for neck pain and neck stiffness.  Neurological: Positive for headaches. Negative for weakness and numbness.  Psychiatric/Behavioral: Negative for suicidal ideas and self-injury.  All other systems reviewed and are negative.      Allergies  Flagyl and Sulfa antibiotics  Home Medications   Current Outpatient Rx  Name  Route  Sig  Dispense  Refill  . Aspirin-Acetaminophen-Caffeine (GOODY HEADACHE PO)   Oral   Take 1 packet by mouth daily as needed (headache).         Marland Kitchen. atorvastatin (LIPITOR) 20 MG tablet   Oral   Take 20 mg by mouth daily.         . diphenhydramine-acetaminophen (TYLENOL PM) 25-500 MG TABS   Oral   Take 1-2 tablets by mouth at bedtime as needed (sleep, headache).         . ergocalciferol (VITAMIN D2) 50000 UNITS capsule   Oral   Take 50,000 Units by mouth 2 (two) times a week. Twice weekly         . LORazepam (ATIVAN) 1 MG tablet   Oral   Take 1 mg by mouth at bedtime.         .Marland Kitchen  naproxen sodium (ANAPROX) 220 MG tablet   Oral   Take 440 mg by mouth 2 (two) times daily as needed (for headache).         Marland Kitchen omeprazole (PRILOSEC) 40 MG capsule   Oral   Take 40 mg by mouth 2 (two) times daily.         Marland Kitchen venlafaxine XR (EFFEXOR-XR) 75 MG 24 hr capsule   Oral   Take 75 mg by mouth daily.          BP 117/68  Pulse 80  Temp(Src) 98.1 F (36.7 C) (Oral)  Resp 18  Ht 5\' 3"  (1.6 m)  Wt 207 lb (93.895 kg)  BMI 36.68 kg/m2  SpO2 97% Physical Exam  Nursing note and vitals reviewed. Constitutional: She is oriented to person, place, and time. She appears well-developed and well-nourished.  HENT:  Head: Normocephalic and  atraumatic.  Right Ear: External ear normal.  Left Ear: External ear normal.  Nose: Nose normal.  Eyes: EOM are normal. Pupils are equal, round, and reactive to light. Right eye exhibits no discharge. Left eye exhibits no discharge.  Neck: Normal range of motion. Neck supple.  Cardiovascular: Normal rate, regular rhythm and normal heart sounds.   No murmur heard. Pulmonary/Chest: Effort normal and breath sounds normal.  Abdominal: Soft. She exhibits no distension. There is no tenderness.  Neurological: She is alert and oriented to person, place, and time.  Reflex Scores:      Bicep reflexes are 2+ on the right side and 2+ on the left side.      Patellar reflexes are 2+ on the right side and 2+ on the left side. CN 2-12 grossly intact. 5/5 strength in all 4 extremities. Normal finger to nose.  Skin: Skin is warm and dry.  Psychiatric: She expresses no homicidal and no suicidal ideation.    ED Course  Procedures (including critical care time) Labs Review Labs Reviewed  BASIC METABOLIC PANEL - Abnormal; Notable for the following:    Potassium 3.6 (*)    Glucose, Bld 149 (*)    All other components within normal limits  CBC  POC URINE PREG, ED   Imaging Review No results found.   EKG Interpretation   Date/Time:  Monday November 30 2013 11:01:29 EDT Ventricular Rate:  74 PR Interval:  147 QRS Duration: 98 QT Interval:  388 QTC Calculation: 430 R Axis:   1 Text Interpretation:  Sinus rhythm RSR' in V1 or V2, probably normal  variant No significant change since last tracing Confirmed by Chick Cousins   MD, Loukisha Gunnerson ([id number]) on 11/30/2013 8:11:12 PM      MDM   Final diagnoses:  Migraine  Anxiety    Patient's headache is recurrent and likely multiple prior migraines. I highly doubt more acute disease such as SAH, infection, thrombosis or stroke. Her pain improved significantly with HA cocktail. She states she feels much better and feels like she'll get more sleep. No SI/HI here. Will  f/u with her psychiatrist and PCP.     Audree Camel, MD 11/30/13 2031

## 2013-11-30 NOTE — ED Notes (Signed)
Pt has been very stressed lately and suffers from anxiety, depression, and migraines. Pt was laying in bed last night feeling very stressed and began to have panic attack- hyperventilating, has had migraine for 3 days, her heart was racing. Pt here today for these recurrent panic attacks and migraine. Feels she is on the verge of a nervous breakdown.

## 2014-03-15 ENCOUNTER — Other Ambulatory Visit (HOSPITAL_COMMUNITY): Payer: Self-pay | Admitting: Physician Assistant

## 2014-03-15 DIAGNOSIS — R599 Enlarged lymph nodes, unspecified: Secondary | ICD-10-CM

## 2014-03-15 DIAGNOSIS — R591 Generalized enlarged lymph nodes: Secondary | ICD-10-CM

## 2014-03-18 ENCOUNTER — Ambulatory Visit (HOSPITAL_COMMUNITY): Payer: BC Managed Care – PPO

## 2014-03-22 ENCOUNTER — Ambulatory Visit (HOSPITAL_COMMUNITY): Payer: BC Managed Care – PPO

## 2014-04-02 ENCOUNTER — Ambulatory Visit (HOSPITAL_COMMUNITY)
Admission: RE | Admit: 2014-04-02 | Discharge: 2014-04-02 | Disposition: A | Discharge status: Home / Self Care | Payer: Self-pay | Source: Ambulatory Visit | Attending: Physician Assistant | Admitting: Physician Assistant

## 2014-04-02 DIAGNOSIS — R220 Localized swelling, mass and lump, head: Secondary | ICD-10-CM | POA: Insufficient documentation

## 2014-04-02 DIAGNOSIS — R599 Enlarged lymph nodes, unspecified: Secondary | ICD-10-CM | POA: Insufficient documentation

## 2014-04-02 DIAGNOSIS — R591 Generalized enlarged lymph nodes: Secondary | ICD-10-CM

## 2014-04-02 DIAGNOSIS — R221 Localized swelling, mass and lump, neck: Principal | ICD-10-CM

## 2014-04-02 DIAGNOSIS — M542 Cervicalgia: Secondary | ICD-10-CM | POA: Insufficient documentation

## 2014-04-02 MED ORDER — IOHEXOL 300 MG/ML  SOLN
80.0000 mL | Freq: Once | INTRAMUSCULAR | Status: AC | PRN
  Administered 2014-04-02: 80 mL via INTRAVENOUS

## 2014-06-09 ENCOUNTER — Other Ambulatory Visit (HOSPITAL_COMMUNITY): Payer: Self-pay | Admitting: Physician Assistant

## 2014-06-09 DIAGNOSIS — Z1231 Encounter for screening mammogram for malignant neoplasm of breast: Secondary | ICD-10-CM

## 2014-08-16 ENCOUNTER — Telehealth: Payer: Self-pay | Admitting: Neurology

## 2014-08-16 NOTE — Telephone Encounter (Signed)
Pt called to cancel her NP appt w/ dr. Allena KatzPatel for 08/19/14. Pt is self pay and does not have the funds to pay her $185.00 copay. Pt will call later to r/s. Dr. Mcelroy/ referring provider was notified.

## 2014-08-19 ENCOUNTER — Ambulatory Visit: Payer: Self-pay | Admitting: Neurology

## 2014-08-19 ENCOUNTER — Ambulatory Visit (HOSPITAL_COMMUNITY)
Admission: RE | Admit: 2014-08-19 | Discharge: 2014-08-19 | Disposition: A | Discharge status: Home / Self Care | Payer: BC Managed Care – PPO | Source: Ambulatory Visit | Attending: Physician Assistant | Admitting: Physician Assistant

## 2014-08-19 DIAGNOSIS — Z1231 Encounter for screening mammogram for malignant neoplasm of breast: Secondary | ICD-10-CM | POA: Insufficient documentation

## 2014-08-22 ENCOUNTER — Encounter (HOSPITAL_COMMUNITY): Payer: Self-pay | Admitting: *Deleted

## 2014-08-22 ENCOUNTER — Emergency Department (HOSPITAL_COMMUNITY)
Admission: EM | Admit: 2014-08-22 | Discharge: 2014-08-22 | Disposition: A | Discharge status: Home / Self Care | Payer: Self-pay | Attending: Emergency Medicine | Admitting: Emergency Medicine

## 2014-08-22 DIAGNOSIS — Z79899 Other long term (current) drug therapy: Secondary | ICD-10-CM | POA: Insufficient documentation

## 2014-08-22 DIAGNOSIS — G43709 Chronic migraine without aura, not intractable, without status migrainosus: Secondary | ICD-10-CM | POA: Insufficient documentation

## 2014-08-22 DIAGNOSIS — M25532 Pain in left wrist: Secondary | ICD-10-CM | POA: Insufficient documentation

## 2014-08-22 DIAGNOSIS — K219 Gastro-esophageal reflux disease without esophagitis: Secondary | ICD-10-CM | POA: Insufficient documentation

## 2014-08-22 DIAGNOSIS — F410 Panic disorder [episodic paroxysmal anxiety] without agoraphobia: Secondary | ICD-10-CM | POA: Insufficient documentation

## 2014-08-22 DIAGNOSIS — Z8669 Personal history of other diseases of the nervous system and sense organs: Secondary | ICD-10-CM | POA: Insufficient documentation

## 2014-08-22 DIAGNOSIS — E785 Hyperlipidemia, unspecified: Secondary | ICD-10-CM | POA: Insufficient documentation

## 2014-08-22 DIAGNOSIS — E669 Obesity, unspecified: Secondary | ICD-10-CM | POA: Insufficient documentation

## 2014-08-22 MED ORDER — NAPROXEN 250 MG PO TABS
500.0000 mg | ORAL_TABLET | Freq: Once | ORAL | Status: AC
  Administered 2014-08-22: 500 mg via ORAL
  Filled 2014-08-22: qty 2

## 2014-08-22 MED ORDER — NAPROXEN 500 MG PO TABS: 500.0000 mg | ORAL_TABLET | Freq: Two times a day (BID) | ORAL | Status: AC

## 2014-08-22 MED ORDER — HYDROCODONE-ACETAMINOPHEN 5-325 MG PO TABS: 1.0000 | ORAL_TABLET | ORAL | Status: DC | PRN

## 2014-08-22 NOTE — Discharge Instructions (Signed)
Follow directions provided. Is important return to your orthopedic doctor for further management of your carpal tunnel pain. Please wear cockup splints as directed. Please take the anti-inflammatory medicine, naproxen, twice a day for pain and inflammation. Please take the pain medicine, Vicodin, as needed to help with pain. Use the resources guide below to find a primary care doctor for management of chronic, persistent problems. Don't hesitate to return for any new, worsening, or concerning symptoms.  SEEK IMMEDIATE MEDICAL CARE IF:  You have new, unexplained symptoms.  Your symptoms get worse and are not helped or controlled with medicines.   Emergency Department Resource Guide 1) Find a Doctor and Pay Out of Pocket Although you won't have to find out who is covered by your insurance plan, it is a good idea to ask around and get recommendations. You will then need to call the office and see if the doctor you have chosen will accept you as a new patient and what types of options they offer for patients who are self-pay. Some doctors offer discounts or will set up payment plans for their patients who do not have insurance, but you will need to ask so you aren't surprised when you get to your appointment.  2) Contact Your Local Health Department Not all health departments have doctors that can see patients for sick visits, but many do, so it is worth a call to see if yours does. If you don't know where your local health department is, you can check in your phone book. The CDC also has a tool to help you locate your state's health department, and many state websites also have listings of all of their local health departments.  3) Find a Walk-in Clinic If your illness is not likely to be very severe or complicated, you may want to try a walk in clinic. These are popping up all over the country in pharmacies, drugstores, and shopping centers. They're usually staffed by nurse practitioners or physician  assistants that have been trained to treat common illnesses and complaints. They're usually fairly quick and inexpensive. However, if you have serious medical issues or chronic medical problems, these are probably not your best option.  No Primary Care Doctor: - Call Health Connect at  [phone] - they can help you locate a primary care doctor that  accepts your insurance, provides certain services, etc. - Physician Referral Service- [phone]  Chronic Pain Problems: Organization         Address  Phone   Notes  [facility]  [phone] Patients need to be referred by their primary care doctor.   Medication Assistance: Organization         Address  Phone   Notes  [facility] [address] [place], [place] [address] [phone] --Must be a resident of [place] [place] [facility] [place] -- Must have NO insurance coverage whatsoever (no Medicaid/ Medicare, etc.) -- The pt. MUST have a primary care doctor that directs their care regularly and follows them in the community   Med[facility]ist  [phone]   [employer]  [phone]    Agencies that provide inexpensive medical care: Organization         Address  Phone   Notes  [facility]  [phone]) [phone]   [facility]    [phone]   [facility] [address] [place], [place] [address] [phone]) [phone]   [facility]  [address], [place] [phone]) [phone]   Planned Parenthood    [phone]   [facility]    [phone]   [facility]  [address], [place] Phone:  [phone], Fax:  [phone]) [phone] Hours of Operation:  9 am - 6 pm, M-F.  Also accepts Medicaid/Medicare and self-pay.  [facility]  [address], [place] Phone: [phone], Fax: [phone]. Hours of Operation:  8:30 am - 5:30 pm, M-F.  Also accepts  Medicaid and self-pay.  [facility] [place] [address], [place] Phone: [phone]   [facility] [address], [place], [place] [phone], Ext. [phone] Mondays & Thursdays: 7-9 AM.  First 15 patients are seen on a first come, first serve basis.    Medicaid-accepting [facility] Providers:  Organization         Address  Phone   Notes   [facility] [address], [place] [phone] Also accepts self-pay patients.  [facility] [address], [place]  [phone]   [facility] [address], [place] [phone]) [phone]   [facility] [address] [place] [address], [place] [phone]) [phone]   Renaye RakersVeita Bland [address], [place]   [phone]) [phone] Only accepts [place] Access [place] patients after they have their name applied to their card.   Self-Pay (no insurance) in [facility] [place]:  Organization         Address  Phone   Notes  Sickle Cell Patients, [facility] [address], [place] [phone]   [facility] [address], [place] [phone]) [phone]   [facility] [place]  [address], [place] [phone]   [facility]/Dr. Osei-Bonsu  [address] [place] [address], [place] or [phone], [place] [phone] Phone number for both [place] and [place] locations is the same.  [facility] [address], [place] [phone]) [phone]   [facility] [place] [address] [place] [address], [place] or [address] [phone]) [phone]   [facility] [address], [place] [phone]) [phone], phone; [phone], fax Sees patients 1st and 3rd Saturday of every month.  Must not qualify for public or private insurance (i.e. Medicaid, Medicare, [place] Health Choice, Veterans' Benefits)  Household  income should be no more than 200% of the poverty level The clinic cannot treat you if you are pregnant or think you are pregnant  Sexually transmitted diseases are not treated at the clinic.    Dental Care: Organization         Address  Phone  Notes  [facility] [address], [place] [phone]) [phone] Accepts children up to age 45 who are enrolled in [place] or [place] Health Choice; pregnant women with a Medicaid card; and children who have applied for Medicaid or [employer] Health Choice, but were declined, whose parents can pay a reduced fee at time of service.  [facility] of [facility] [place]  [address], [place] [phone]) [phone] Accepts children up to age 45 who are enrolled in [place] or  Health Choice; pregnant women with a Medicaid card; and children who have applied  for Medicaid or [place] Health Choice, but were declined, whose parents can pay a reduced fee at time of service.  [facility]  [address] [place], [place] [phone] Patients are seen by appointment only. Walk-ins are not accepted. [facility] will see patients 78 years of age and older. Monday - Tuesday (8am-5pm) Most Wednesdays (8:30-5pm) $30 per visit, cash only  [facility]  [address], [facility] [phone] Patients are seen by appointment only. Walk-ins are not accepted. [facility] will see patients 67 years of age and older. One Wednesday Evening (Monthly: Volunteer Based).  $30 per visit, cash only  [employer]  [phone] for adults; Children under age 100, call Graduate Pediatric Dentistry at [phone]. Children aged 31-14, please call [phone] to request a pediatric application.  Dental services are provided in all areas of dental care including fillings, crowns and bridges, complete and partial dentures, implants, gum  treatment, root canals, and extractions. Preventive care is also provided. Treatment is provided to both adults and children. Patients are selected via a lottery and there is often a waiting list.   [facility] [address], [place]  [phone] [email or web address]   [facility] [address] [place], [place] [phone], Ext. [phone] Second and Fourth Thursday of each month, opens at 6:30 AM; Clinic ends at 9 AM.  Patients are seen on a first-come first-served basis, and a limited number are seen during each clinic.   [facility]  [address] [place], [place] [phone]   Eligibility Requirements You must have lived in [place], [place], or [place] counties for at least the last three months.   You cannot be eligible for state or federal sponsored [place], including [employer], [place], or [employer].   You generally cannot be eligible for healthcare insurance through your employer.    How to apply: Eligibility screenings are held every Tuesday and Wednesday afternoon from 1:00 pm until 4:00 pm. You do not need an appointment for the interview!  [facility] [address], [place], [place] [phone]   [facility] Health Department  [phone]   [facility] Health Department  [phone]   [facility] Health Department  [phone]    Behavioral Health Resources in the Community: Intensive Outpatient Programs Organization         Address  Phone  Notes  [facility] Services [address] [place], [place] [phone]   [facility] Outpatient [address], [place], [place] [phone]   ADS: Alcohol & Drug Svcs [address], [place], [place]  [phone]   [facility] [address],  [place], [place] [phone] or [phone]   Substance Abuse Resources Organization         Address  Phone  Notes  Alcohol and  Drug Services  [phone]   [facility]  [phone]   [facility] [place]  [phone]   Floydene Flock  [phone]   Residential & Outpatient Substance Abuse Program  [phone]   Psychological Services Organization         Address  Phone  Notes  [facility]al Health  [phone]   [facility] Services  [phone]   [facility] Mental Health [address], [place] [phone] or [phone]    Mobile Crisis Teams Organization  Address  Phone  Notes  [facility], Mobile Crisis Care Unit  [phone]   [facility]  [address] [place], [place]   [facility] [address], [place] [phone]    Self-Help/Support Groups Organization         Address  Phone             Notes  [place]. of [place] - variety of support groups  [place] Call for more information  Narcotics Anonymous (NA), [facility] [address], [employer] [place]  2 meetings at this location   Special educational needs teacher         Address  Phone  Notes  [facility] Residential Treatment [place],    [place]  [phone]   [facility]  [address] [place] [address], [place], [place]   [place], [place] [phone] Admissions: 8am-3pm M-F  Incentives Substance [place] [address],    [place], [place] [phone]   [facility] [address] [place], [place], [place]   [facility] [address],  [place], [place]   [facility]ight Programs - Intensive Outpatient [address], [place], [place]   [facility] [address]  [place], [place] [phone] or [phone]   [facility] (RTS) [address], [place], [place] Accepts Medicaid  Fellowship  [place] [address],  [place] [phone] Substance Abuse/Addiction Treatment   [facility] Organization         Address  Phone  Notes  [facility]  [phone]   Domenic Schwab, PhD [address]lis [address] [place], [place]   [phone] or [phone]   [place] [facility] [place], [place] [phone]   [facility] [address] [place], [place] [phone] Insurance/Medicaid/sponsorship through [facility] and Families [address], [address]                                    [place], [place] [phone] [place] [address], [place] [phone]    Dr. Adele Schilder  [phone]   [facility] Dept. 1) [address] [place] 2) [place] 3)  [address], [place] [phone]  [phone]   [place] [phone] or [phone] (After Hours)

## 2014-08-22 NOTE — ED Notes (Signed)
Declined W/C at D/C and was escorted to lobby by RN. 

## 2014-08-22 NOTE — ED Notes (Signed)
PT reports she has carple tunnel in bil wrist.

## 2014-08-22 NOTE — ED Provider Notes (Signed)
CSN: [id number]     Arrival date & time 08/22/14  [id number] History   First MD Initiated Contact with Patient 08/22/14 [phone]     Chief Complaint  Patient presents with  . Extremity Laceration   (Consider location/radiation/quality/duration/timing/severity/associated sxs/prior Treatment) HPI Dana Salazar is a 43 yo female presenting with report of worsening left wrist pain.  She reports this is an ongoing problem and she has been evaluated by ortho in the past and diagnosed with carpal tunnel syndrome.  She was supposed to have surgery but her insurance ran out several months ago and she has not been able to have the surgery.  Her left wrist has been bothering her more and it makes it difficult to sleep. She endorse wearing her cock-up splints bilat. She denies any fevers, new injury, or redness, or  swelling to the wrist.   Past Medical History  Diagnosis Date  . Guillain-Barre syndrome   . Anxiety and depression   . IBS (irritable bowel syndrome)   . Chronic migraine   . Diverticulitis   . Hyperlipidemia   . GERD (gastroesophageal reflux disease)   . Disturbance of skin sensation 09/21/2013  . Obesity   . Panic attack    Past Surgical History  Procedure Laterality Date  . Abdominal hysterectomy  1997  . Cholecystectomy  2007   Family History  Problem Relation Age of Onset  . Heart attack Father   . Bipolar disorder Sister   . Neurofibromatosis Paternal Aunt   . Dementia Maternal Grandmother    History  Substance Use Topics  . Smoking status: Never Smoker   . Smokeless tobacco: Never Used  . Alcohol Use: No   OB History    No data available     Review of Systems  Constitutional: Negative for fever and chills.  Respiratory: Negative for shortness of breath.   Cardiovascular: Negative for chest pain.  Musculoskeletal: Positive for arthralgias. Negative for myalgias.  Skin: Negative for color change and rash.  Neurological: Positive for weakness. Negative for numbness.        Allergies  Flagyl and Sulfa antibiotics  Home Medications   Prior to Admission medications   Medication Sig Start Date End Date Taking? Authorizing Provider  Aspirin-Acetaminophen-Caffeine (GOODY HEADACHE PO) Take 1 packet by mouth daily as needed (headache).   Yes Historical Provider, MD  diphenhydramine-acetaminophen (TYLENOL PM) 25-500 MG TABS Take 1-2 tablets by mouth at bedtime as needed (sleep, headache).   Yes Historical Provider, MD  Omega-3 Fatty Acids (OMEGA-3 FISH OIL) 1200 MG CAPS Take 2 capsules by mouth daily.   Yes Historical Provider, MD  ranitidine (ZANTAC) 300 MG tablet Take 300 mg by mouth at bedtime.   Yes Historical Provider, MD  simvastatin (ZOCOR) 20 MG tablet Take 20 mg by mouth daily.   Yes Historical Provider, MD  venlafaxine XR (EFFEXOR-XR) 75 MG 24 hr capsule Take 75 mg by mouth daily.   Yes Historical Provider, MD   There were no vitals taken for this visit. Physical Exam  Constitutional: She appears well-developed and well-nourished. No distress.  HENT:  Head: Normocephalic and atraumatic.  Eyes: Conjunctivae are normal.  Neck: Neck supple. No thyromegaly present.  Cardiovascular: Normal rate, regular rhythm and intact distal pulses.   Pulmonary/Chest: Effort normal and breath sounds normal. No respiratory distress.  Abdominal: Soft. There is no tenderness.  Musculoskeletal:       Left wrist: She exhibits tenderness. She exhibits no bony tenderness, no swelling, no effusion and no  crepitus.       Arms: Lymphadenopathy:    She has no cervical adenopathy.  Neurological: She is alert.  Skin: Skin is warm and dry. No rash noted. She is not diaphoretic.  Nursing note and vitals reviewed.   ED Course  Procedures (including critical care time) Labs Review Labs Reviewed - No data to display  Imaging Review No results found.   EKG Interpretation None      MDM   Final diagnoses:  Left wrist pain   43 yo with exacerbation of wrist pain  due to previous dx of carpal tunnel syndrome. No indication for joint infection or fracture.  Short course of pain meds with instructions to take NSAIDS regularly and resources to follow-up with orthopedic and PCP. Pt is well-appearing, in no acute distress and vital signs are stable.  They appear safe to be discharged.  Return precautions provided.    Filed Vitals:   08/22/14 0901  BP: 123/85  Pulse: 88  Temp: 98.6 F (37 C)  Resp: 18  SpO2: 97%    Meds given in ED:  Medications - No data to display  Discharge Medication List as of 08/22/2014  9:01 AM    START taking these medications   Details  HYDROcodone-acetaminophen (NORCO/VICODIN) 5-325 MG per tablet Take 1-2 tablets by mouth every 4 (four) hours as needed for moderate pain or severe pain., Starting 08/22/2014, Until Discontinued, Print    naproxen (NAPROSYN) 500 MG tablet Take 1 tablet (500 mg total) by mouth 2 (two) times daily with a meal., Starting 08/22/2014, Until Discontinued, Print           Harle BattiestElizabeth Takuma Cifelli, NP 08/23/14 1010  Elwin MochaBlair Walden, MD 08/23/14 1555

## 2014-09-14 ENCOUNTER — Telehealth: Payer: Self-pay | Admitting: Neurology

## 2014-09-14 ENCOUNTER — Ambulatory Visit (HOSPITAL_COMMUNITY)
Admission: RE | Admit: 2014-09-14 | Discharge: 2014-09-14 | Disposition: A | Discharge status: Home / Self Care | Payer: Self-pay | Source: Ambulatory Visit | Attending: Neurology | Admitting: Neurology

## 2014-09-14 DIAGNOSIS — R200 Anesthesia of skin: Secondary | ICD-10-CM | POA: Insufficient documentation

## 2014-09-14 DIAGNOSIS — R209 Unspecified disturbances of skin sensation: Secondary | ICD-10-CM

## 2014-09-14 MED ORDER — GADOBENATE DIMEGLUMINE 529 MG/ML IV SOLN
20.0000 mL | Freq: Once | INTRAVENOUS | Status: AC | PRN
  Administered 2014-09-14: 20 mL via INTRAVENOUS

## 2014-09-14 NOTE — Telephone Encounter (Signed)
  I called patient. MRI study of the brain and cervical spine are essentially normal. Minimal white matter changes on the brain, the patient has a history of chronic migraine headache. She is still having intermittent sensory complaints, and weakness in the arms. I will try to get a revisit set up for her, I have not seen her in about one year. She indicates that there has been no progression in her symptoms at that time.   MRI cervical spine 09/14/2014:  IMPRESSION: 1. Unremarkable appearance of the cervical spinal cord. No disc herniation or stenosis. 2. Unchanged, mild cerebellar tonsillar ectopia.    MRI brain 09/14/2014:  IMPRESSION: 1. No acute intracranial abnormality or mass. 2. Unchanged, mild cerebellar tonsillar ectopia. 3. Several small foci of white matter T2 signal abnormality, nonspecific and not necessarily greater than expected for patient's age. These could reflect mild chronic small vessel ischemic disease, with other considerations including sequelae of migraines, prior infection, or trauma. No lesions specifically suggestive of demyelinating disease are seen.

## 2014-09-22 NOTE — Telephone Encounter (Signed)
Spoke to patient and she is scheduled on 10-15-14 at 1100.

## 2014-10-15 ENCOUNTER — Ambulatory Visit: Payer: Self-pay | Admitting: Neurology

## 2015-01-03 DIAGNOSIS — K219 Gastro-esophageal reflux disease without esophagitis: Secondary | ICD-10-CM | POA: Diagnosis not present

## 2015-01-03 DIAGNOSIS — R202 Paresthesia of skin: Secondary | ICD-10-CM | POA: Diagnosis not present

## 2015-01-03 DIAGNOSIS — R1011 Right upper quadrant pain: Secondary | ICD-10-CM | POA: Diagnosis not present

## 2015-01-03 DIAGNOSIS — N390 Urinary tract infection, site not specified: Secondary | ICD-10-CM | POA: Diagnosis not present

## 2015-01-03 DIAGNOSIS — R1013 Epigastric pain: Secondary | ICD-10-CM | POA: Diagnosis not present

## 2015-01-03 DIAGNOSIS — R350 Frequency of micturition: Secondary | ICD-10-CM | POA: Diagnosis not present

## 2015-01-03 DIAGNOSIS — G5600 Carpal tunnel syndrome, unspecified upper limb: Secondary | ICD-10-CM | POA: Diagnosis not present

## 2015-01-03 DIAGNOSIS — R319 Hematuria, unspecified: Secondary | ICD-10-CM | POA: Diagnosis not present

## 2015-01-07 DIAGNOSIS — Z0100 Encounter for examination of eyes and vision without abnormal findings: Secondary | ICD-10-CM | POA: Diagnosis not present

## 2015-01-11 ENCOUNTER — Ambulatory Visit (INDEPENDENT_AMBULATORY_CARE_PROVIDER_SITE_OTHER): Payer: Commercial Managed Care - HMO | Admitting: Neurology

## 2015-01-11 ENCOUNTER — Encounter: Payer: Self-pay | Admitting: Neurology

## 2015-01-11 VITALS — BP 122/84 | HR 76 | Ht 63.0 in | Wt 208.8 lb

## 2015-01-11 DIAGNOSIS — R209 Unspecified disturbances of skin sensation: Secondary | ICD-10-CM | POA: Diagnosis not present

## 2015-01-11 DIAGNOSIS — R202 Paresthesia of skin: Secondary | ICD-10-CM | POA: Diagnosis not present

## 2015-01-11 NOTE — Patient Instructions (Signed)

## 2015-01-11 NOTE — Progress Notes (Signed)
Reason for visit: Paresthesias  Dana Salazar is an 44 y.o. female  History of present illness:  Ms. Dana Salazar is a 44 year old right-handed black female with a history of paresthesias affecting all 4 extremities. Initially, she would indicate that the sensory changes would come and go, but over the last several months, the symptoms have become more persistent. The patient has episodes of right fifth toe numbness lasting 2 or 3 days, and she has numbness, tingling and dysesthesias on the bottom of the feet virtually every night. This may keep her awake at night. The patient has pain in the hands and wrists, she has been told she has carpal tunnel syndrome, she has not yet had surgery done. The patient underwent MRI of the brain that shows minimal white matter changes that are consistent with a history of migraine headache. The patient did not have any lesions within the cervical spinal cord on cervical MRI. The patient denies any balance issues, and no falls. She returns for further evaluation.  Past Medical History  Diagnosis Date  . Guillain-Barre syndrome   . Anxiety and depression   . IBS (irritable bowel syndrome)   . Chronic migraine   . Diverticulitis   . Hyperlipidemia   . GERD (gastroesophageal reflux disease)   . Disturbance of skin sensation 09/21/2013  . Obesity   . Panic attack   . Guillain Barr syndrome     Past Surgical History  Procedure Laterality Date  . Abdominal hysterectomy  1997  . Cholecystectomy  2007    Family History  Problem Relation Age of Onset  . Heart attack Father   . Bipolar disorder Sister   . Neurofibromatosis Paternal Aunt   . Dementia Maternal Grandmother     Social history:  reports that she has never smoked. She has never used smokeless tobacco. She reports that she does not drink alcohol or use illicit drugs.    Allergies  Allergen Reactions  . Flagyl [Metronidazole Hcl] Other (See Comments)    guillian barre syndrome  . Sulfa  Antibiotics Rash    Medications:  Prior to Admission medications   Medication Sig Start Date End Date Taking? Authorizing Provider  Aspirin-Acetaminophen-Caffeine (GOODY HEADACHE PO) Take 1 packet by mouth daily as needed (headache).   Yes Historical Provider, MD  diphenhydramine-acetaminophen (TYLENOL PM) 25-500 MG TABS Take 1-2 tablets by mouth at bedtime as needed (sleep, headache).   Yes Historical Provider, MD  naproxen (NAPROSYN) 500 MG tablet Take 1 tablet (500 mg total) by mouth 2 (two) times daily with a meal. 08/22/14  Yes Harle BattiestElizabeth Tysinger, NP  Omega-3 Fatty Acids (OMEGA-3 FISH OIL) 1200 MG CAPS Take 2 capsules by mouth daily.   Yes Historical Provider, MD  ranitidine (ZANTAC) 300 MG tablet Take 300 mg by mouth at bedtime.   Yes Historical Provider, MD  simvastatin (ZOCOR) 20 MG tablet Take 20 mg by mouth daily.   Yes Historical Provider, MD  traZODone (DESYREL) 100 MG tablet Take 100 mg by mouth at bedtime.   Yes Historical Provider, MD  venlafaxine XR (EFFEXOR-XR) 75 MG 24 hr capsule Take 75 mg by mouth daily.   Yes Historical Provider, MD    ROS:  Out of a complete 14 system review of symptoms, the patient complains only of the following symptoms, and all other reviewed systems are negative.  Swollen abdomen, abdominal pain, diarrhea Snoring Frequency of urination Walking difficulty Numbness, weakness Anxiety  Blood pressure 122/84, pulse 76, height 5\' 3"  (1.6 m),  weight 208 lb 12.8 oz (94.711 kg).  Physical Exam  General: The patient is alert and cooperative at the time of the examination. The patient is markedly obese.  Neuromuscular: Range of movement of the cervical spine is full. The patient has full flexion and extension movements of the low back.  Skin: No significant peripheral edema is noted.   Neurologic Exam  Mental status: The patient is alert and oriented x 3 at the time of the examination. The patient has apparent normal recent and remote memory,  with an apparently normal attention span and concentration ability.   Cranial nerves: Facial symmetry is present. Speech is normal, no aphasia or dysarthria is noted. Extraocular movements are full. Visual fields are full.  Motor: The patient has good strength in all 4 extremities.  Sensory examination: Soft touch sensation is symmetric on the face, arms, and legs. There is a stocking pattern pinprick sensory deficit one half way up the legs bilaterally.  Coordination: The patient has good finger-nose-finger and heel-to-shin bilaterally.  Gait and station: The patient has a normal gait. Tandem gait is minimally unsteady. Romberg is negative. No drift is seen.  Reflexes: Deep tendon reflexes are symmetric. The ankle jerk reflexes are well-maintained bilaterally.   MRI cervical spine 09/14/2014:  IMPRESSION: 1. Unremarkable appearance of the cervical spinal cord. No disc herniation or stenosis. 2. Unchanged, mild cerebellar tonsillar ectopia.    MRI brain 09/14/2014:  IMPRESSION: 1. No acute intracranial abnormality or mass. 2. Unchanged, mild cerebellar tonsillar ectopia. 3. Several small foci of white matter T2 signal abnormality, nonspecific and not necessarily greater than expected for patient's age. These could reflect mild chronic small vessel ischemic disease, with other considerations including sequelae of migraines, prior infection, or trauma. No lesions specifically suggestive of demyelinating disease are seen.  * MRI scan images were reviewed online. I agree with the written report.    Assessment/Plan:  1. Paresthesias, all 4 extremities  2. History of bilateral carpal tunnel syndrome  3. Migraine headache  The patient will be set up for nerve conduction studies on all 4 extremities. EMG will be done on one leg. The patient will need to be evaluated for possible peripheral neuropathy. Blood work done previously looking for etiologies of neuropathy dysfunction  was negative.  Dana Salazar. Dana Willis MD 01/11/2015 5:53 PM  [facility] [address] [place], [place] [id number]  Phone [phone] Fax [phone]

## 2015-01-12 ENCOUNTER — Encounter (HOSPITAL_BASED_OUTPATIENT_CLINIC_OR_DEPARTMENT_OTHER): Payer: Self-pay | Admitting: *Deleted

## 2015-01-12 ENCOUNTER — Other Ambulatory Visit: Payer: Self-pay | Admitting: Orthopedic Surgery

## 2015-01-12 DIAGNOSIS — M79645 Pain in left finger(s): Secondary | ICD-10-CM | POA: Diagnosis not present

## 2015-01-12 DIAGNOSIS — G5602 Carpal tunnel syndrome, left upper limb: Secondary | ICD-10-CM | POA: Diagnosis not present

## 2015-01-12 DIAGNOSIS — G5601 Carpal tunnel syndrome, right upper limb: Secondary | ICD-10-CM | POA: Diagnosis not present

## 2015-01-14 NOTE — H&P (Signed)
Dana Salazar is an 44 y.o. female.   CC / Reason for Visit:Bilateral hand problems  HPI: This patient returns for persisting bilateral hand numbness and tingling. She indicates that she was scheduled for carpal tunnel release last year, and then lost her insurance but is now ready to proceed.  She reports that her left side is worse at this time than the right and wishes to proceed with left-sided ECTR first.  She also indicates that she's having some difficulty with pinching activities including opening bottles and jars.  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  She has a fullness and tightness in the right palm, with some numbness and tingling in the hands involving primarily the radial digits, sparing the small finger.  The right side is worse than the left.  She has a remote history of a right wrist fracture in 2000 for which she spent 5 months in a cast by her report.  She has not had any recent nighttime splinting.   Past Medical History  Diagnosis Date  . IBS (irritable bowel syndrome)   . Chronic migraine   . Diverticulitis   . Hyperlipidemia   . GERD (gastroesophageal reflux disease)   . Disturbance of skin sensation 09/21/2013  . Obesity   . Panic attack   . Guillain-Barre syndrome     Pt has tingling in feet - saw Dr. Anne HahnWillis 01/11/2015  . Guillain Barr syndrome   . Arthritis     left wrist  . Urinary frequency     Past Surgical History  Procedure Laterality Date  . Abdominal hysterectomy  1997  . Cholecystectomy  2007    Family History  Problem Relation Age of Onset  . Heart attack Father   . Bipolar disorder Sister   . Neurofibromatosis Paternal Aunt   . Dementia Maternal Grandmother    Social History:  reports that she has never smoked. She has never used smokeless tobacco. She reports that she  does not drink alcohol or use illicit drugs.  Allergies:  Allergies  Allergen Reactions  . Flagyl [Metronidazole Hcl] Rash    guillian barre syndrome  . Sulfa Antibiotics Rash    No prescriptions prior to admission    No results found for this or any previous visit (from the past 48 hour(s)). No results found.  Review of Systems  All other systems reviewed and are negative.   Height 5\' 3"  (1.6 m), weight 94.348 kg (208 lb). Physical Exam  Constitutional:  WD, WN, NAD HEENT:  NCAT, EOMI Neuro/Psych:  Alert & oriented to person, place, and time; appropriate mood & affect Lymphatic: No generalized UE edema or lymphadenopathy Extremities / MSK:  Both UE are normal with respect to appearance, ranges of motion, joint stability, muscle strength/tone, sensation, & perfusion except as otherwise noted:  No swelling discoloration or deformity noted in either hand.  Full digital range of motion.  Positive Tinel's over bilateral carpal tunnels.  Grip strength position 2 right 35 left 15.  Monofilaments all digits bilaterally 2.83.  No thenar wasting.   Pain and some crepitus with left CMC grind.  Right CMC grind increased pain but no crepitus felt.  Negative Finkelstein's.  Key pinch left 3 right 3; tip pinch left 1 right 1 with reports of discomfort.  Labs / Xrays:  No radiographic studies obtained today.  Assessment:  1.  Bilateral carpal tunnel syndrome 2.  Possible bilateral CMC arthritis versus soft tissue irritation  Plan: The findings were discussed with the patient.  She plans to proceed with left-sided endoscopic carpal tunnel release on Monday.  She indicates that she does not want a joint injection for her left thumb at this time.  She was provided a cool comfort splint. The details of the operative procedure were discussed with the patient.  Questions were invited and answered.  In addition to the goal of the procedure, the risks of the procedure to include but not limited to  bleeding; infection; damage to the nerves or blood vessels that could result in bleeding, numbness, weakness, chronic pain, and the need for additional procedures; stiffness; the need for revision surgery; and anesthetic risks were reviewed.  No specific outcome was guaranteed or implied.  Informed consent was obtained.   Destin Kittler A. 01/14/2015, 11:45 AM

## 2015-01-17 ENCOUNTER — Encounter (HOSPITAL_BASED_OUTPATIENT_CLINIC_OR_DEPARTMENT_OTHER)
Admission: RE | Disposition: A | Discharge status: Home / Self Care | Payer: Self-pay | Source: Ambulatory Visit | Attending: Orthopedic Surgery

## 2015-01-17 ENCOUNTER — Encounter (HOSPITAL_BASED_OUTPATIENT_CLINIC_OR_DEPARTMENT_OTHER): Payer: Self-pay

## 2015-01-17 ENCOUNTER — Ambulatory Visit (HOSPITAL_BASED_OUTPATIENT_CLINIC_OR_DEPARTMENT_OTHER): Payer: Commercial Managed Care - HMO | Admitting: Certified Registered"

## 2015-01-17 ENCOUNTER — Ambulatory Visit (HOSPITAL_BASED_OUTPATIENT_CLINIC_OR_DEPARTMENT_OTHER)
Admission: RE | Admit: 2015-01-17 | Discharge: 2015-01-17 | Disposition: A | Discharge status: Home / Self Care | Payer: Commercial Managed Care - HMO | Source: Ambulatory Visit | Attending: Orthopedic Surgery | Admitting: Orthopedic Surgery

## 2015-01-17 DIAGNOSIS — G610 Guillain-Barre syndrome: Secondary | ICD-10-CM | POA: Diagnosis not present

## 2015-01-17 DIAGNOSIS — K219 Gastro-esophageal reflux disease without esophagitis: Secondary | ICD-10-CM | POA: Insufficient documentation

## 2015-01-17 DIAGNOSIS — G5601 Carpal tunnel syndrome, right upper limb: Secondary | ICD-10-CM | POA: Insufficient documentation

## 2015-01-17 DIAGNOSIS — E669 Obesity, unspecified: Secondary | ICD-10-CM | POA: Diagnosis not present

## 2015-01-17 DIAGNOSIS — Z6836 Body mass index (BMI) 36.0-36.9, adult: Secondary | ICD-10-CM | POA: Diagnosis not present

## 2015-01-17 DIAGNOSIS — M13832 Other specified arthritis, left wrist: Secondary | ICD-10-CM | POA: Insufficient documentation

## 2015-01-17 DIAGNOSIS — G5602 Carpal tunnel syndrome, left upper limb: Secondary | ICD-10-CM | POA: Insufficient documentation

## 2015-01-17 DIAGNOSIS — G43909 Migraine, unspecified, not intractable, without status migrainosus: Secondary | ICD-10-CM | POA: Insufficient documentation

## 2015-01-17 DIAGNOSIS — K589 Irritable bowel syndrome without diarrhea: Secondary | ICD-10-CM | POA: Insufficient documentation

## 2015-01-17 HISTORY — DX: Frequency of micturition: R35.0

## 2015-01-17 HISTORY — DX: Unspecified osteoarthritis, unspecified site: M19.90

## 2015-01-17 HISTORY — PX: CARPAL TUNNEL RELEASE: SHX101

## 2015-01-17 SURGERY — RELEASE, CARPAL TUNNEL, ENDOSCOPIC
Anesthesia: Monitor Anesthesia Care | Site: Wrist | Laterality: Left

## 2015-01-17 MED ORDER — ONDANSETRON HCL 4 MG/2ML IJ SOLN
INTRAMUSCULAR | Status: DC | PRN
  Administered 2015-01-17: 4 mg via INTRAVENOUS

## 2015-01-17 MED ORDER — LIDOCAINE HCL 2 % IJ SOLN
INTRAMUSCULAR | Status: DC | PRN
  Administered 2015-01-17: 3 mL

## 2015-01-17 MED ORDER — LACTATED RINGERS IV SOLN: INTRAVENOUS | Status: DC

## 2015-01-17 MED ORDER — FENTANYL CITRATE (PF) 100 MCG/2ML IJ SOLN: 50.0000 ug | INTRAMUSCULAR | Status: DC | PRN

## 2015-01-17 MED ORDER — LIDOCAINE HCL 2 % IJ SOLN
INTRAMUSCULAR | Status: AC
  Filled 2015-01-17: qty 20

## 2015-01-17 MED ORDER — MIDAZOLAM HCL 2 MG/2ML IJ SOLN
1.0000 mg | INTRAMUSCULAR | Status: DC | PRN
Start: 2015-01-17 — End: 2015-01-17
  Administered 2015-01-17: 2 mg via INTRAVENOUS

## 2015-01-17 MED ORDER — LACTATED RINGERS IV SOLN
INTRAVENOUS | Status: DC
  Administered 2015-01-17: 07:00:00 via INTRAVENOUS

## 2015-01-17 MED ORDER — CEFAZOLIN SODIUM-DEXTROSE 2-3 GM-% IV SOLR
INTRAVENOUS | Status: AC
  Filled 2015-01-17: qty 50

## 2015-01-17 MED ORDER — MIDAZOLAM HCL 2 MG/2ML IJ SOLN
INTRAMUSCULAR | Status: AC
Start: 2015-01-17 — End: 2015-01-17
  Filled 2015-01-17: qty 2

## 2015-01-17 MED ORDER — FENTANYL CITRATE (PF) 100 MCG/2ML IJ SOLN
INTRAMUSCULAR | Status: DC | PRN
  Administered 2015-01-17: 50 ug via INTRAVENOUS

## 2015-01-17 MED ORDER — FENTANYL CITRATE (PF) 100 MCG/2ML IJ SOLN
INTRAMUSCULAR | Status: AC
  Filled 2015-01-17: qty 2

## 2015-01-17 MED ORDER — PROPOFOL INFUSION 10 MG/ML OPTIME
INTRAVENOUS | Status: DC | PRN
  Administered 2015-01-17: 75 ug/kg/min via INTRAVENOUS

## 2015-01-17 MED ORDER — BUPIVACAINE-EPINEPHRINE (PF) 0.5% -1:200000 IJ SOLN
INTRAMUSCULAR | Status: AC
  Filled 2015-01-17: qty 30

## 2015-01-17 MED ORDER — CEFAZOLIN SODIUM-DEXTROSE 2-3 GM-% IV SOLR
2.0000 g | INTRAVENOUS | Status: AC
  Administered 2015-01-17: 2 g via INTRAVENOUS

## 2015-01-17 MED ORDER — BUPIVACAINE-EPINEPHRINE 0.5% -1:200000 IJ SOLN
INTRAMUSCULAR | Status: DC | PRN
  Administered 2015-01-17: 3 mL

## 2015-01-17 MED ORDER — GLYCOPYRROLATE 0.2 MG/ML IJ SOLN
0.2000 mg | Freq: Once | INTRAMUSCULAR | Status: DC | PRN
Start: 2015-01-17 — End: 2015-01-17

## 2015-01-17 SURGICAL SUPPLY — 42 items
APPLICATOR COTTON TIP 6IN STRL (MISCELLANEOUS) ×3 IMPLANT
BLADE HOOK ENDO STRL (BLADE) ×3 IMPLANT
BLADE SURG 15 STRL LF DISP TIS (BLADE) ×1 IMPLANT
BLADE SURG 15 STRL SS (BLADE) ×3
BLADE TRIANGLE EPF/EGR ENDO (BLADE) ×3 IMPLANT
BNDG CMPR 9X4 STRL LF SNTH (GAUZE/BANDAGES/DRESSINGS) ×1
BNDG COHESIVE 4X5 TAN STRL (GAUZE/BANDAGES/DRESSINGS) ×3 IMPLANT
BNDG ESMARK 4X9 LF (GAUZE/BANDAGES/DRESSINGS) ×3 IMPLANT
BNDG GAUZE ELAST 4 BULKY (GAUZE/BANDAGES/DRESSINGS) ×3 IMPLANT
CHLORAPREP W/TINT 26ML (MISCELLANEOUS) ×3 IMPLANT
CORDS BIPOLAR (ELECTRODE) IMPLANT
COVER BACK TABLE 60X90IN (DRAPES) ×3 IMPLANT
COVER MAYO STAND STRL (DRAPES) ×3 IMPLANT
CUFF TOURNIQUET SINGLE 18IN (TOURNIQUET CUFF) IMPLANT
DRAIN TLS ROUND 10FR (DRAIN) ×3 IMPLANT
DRAPE EXTREMITY T 121X128X90 (DRAPE) ×3 IMPLANT
DRAPE SURG 17X23 STRL (DRAPES) ×3 IMPLANT
DRSG EMULSION OIL 3X3 NADH (GAUZE/BANDAGES/DRESSINGS) ×3 IMPLANT
GLOVE BIO SURGEON STRL SZ7.5 (GLOVE) ×3 IMPLANT
GLOVE BIOGEL PI IND STRL 7.0 (GLOVE) ×1 IMPLANT
GLOVE BIOGEL PI IND STRL 8 (GLOVE) ×1 IMPLANT
GLOVE BIOGEL PI INDICATOR 7.0 (GLOVE) ×4
GLOVE BIOGEL PI INDICATOR 8 (GLOVE) ×2
GLOVE ECLIPSE 6.5 STRL STRAW (GLOVE) ×5 IMPLANT
GOWN STRL REUS W/ TWL LRG LVL3 (GOWN DISPOSABLE) ×2 IMPLANT
GOWN STRL REUS W/TWL LRG LVL3 (GOWN DISPOSABLE) ×6
GOWN STRL REUS W/TWL XL LVL3 (GOWN DISPOSABLE) ×3 IMPLANT
NDL HYPO 25X1 1.5 SAFETY (NEEDLE) IMPLANT
NEEDLE HYPO 25X1 1.5 SAFETY (NEEDLE) IMPLANT
NS IRRIG 1000ML POUR BTL (IV SOLUTION) ×3 IMPLANT
PACK BASIN DAY SURGERY FS (CUSTOM PROCEDURE TRAY) ×3 IMPLANT
PADDING CAST ABS 4INX4YD NS (CAST SUPPLIES)
PADDING CAST ABS COTTON 4X4 ST (CAST SUPPLIES) IMPLANT
SPONGE GAUZE 4X4 12PLY STER LF (GAUZE/BANDAGES/DRESSINGS) ×3 IMPLANT
STOCKINETTE 6  STRL (DRAPES) ×2
STOCKINETTE 6 STRL (DRAPES) ×1 IMPLANT
SUT VICRYL RAPIDE 4-0 (SUTURE) ×3 IMPLANT
SYR BULB 3OZ (MISCELLANEOUS) ×3 IMPLANT
SYRINGE 10CC LL (SYRINGE) ×2 IMPLANT
TOWEL OR 17X24 6PK STRL BLUE (TOWEL DISPOSABLE) ×6 IMPLANT
TOWEL OR NON WOVEN STRL DISP B (DISPOSABLE) ×3 IMPLANT
UNDERPAD 30X30 (UNDERPADS AND DIAPERS) ×3 IMPLANT

## 2015-01-17 NOTE — Discharge Instructions (Signed)
Discharge Instructions ° ° °You have a light dressing on your hand.  °You may begin gentle motion of your fingers and hand immediately, but you should not do any heavy lifting or gripping.  Elevate your hand to reduce pain & swelling of the digits.  Ice over the operative site may be helpful to reduce pain & swelling.  DO NOT USE HEAT. °Pain medicine has been prescribed for you.  °Use your medicine as needed over the first 48 hours, and then you can begin to taper your use. You may use Tylenol in place of your prescribed pain medication, but not IN ADDITION to it. °Leave the dressing in place until the third day after your surgery and then remove it, leaving it open to air.  °After the bandage has been removed you may shower, but do not soak the incision.  °You may drive a car when you are off of prescription pain medications and can safely control your vehicle with both hands. °We will address whether therapy will be required or not when you return to the office. °You may have already made your follow-up appointment when we completed your preop visit.  If not, please call our office today or the next business day to make your return appointment for 10-15 days after surgery. ° ° °Please call [phone] during normal business hours or [phone] after hours for any problems. Including the following: ° °- excessive redness of the incisions °- drainage for more than 4 days °- fever of more than 101.5 F ° °*Please note that pain medications will not be refilled after hours or on weekends. ° ° °[facility] Drain Instructions °You have a drain tube in place to help limit the amount of blood that collects under your skin in the early post-operative period.  The amount it drains will vary from person-to-person and is dependent upon many factors.  You will have 1-2 extra drain tubes sent with you from the facility.  You should change the tube according to these instructions below when the tube is about half full. ° °BE SURE TO  SLIDE THE CLAMP TO THE CLOSED POSITION BEFORE CHANGING THE TUBE.   ° °RE-OPEN THE CLAMP ONCE THE GLASS EVACUATION TUBE HAS BEEN CHANGED. ° °24 hours after surgery the amount of drainage will likely be negligible and it will be time to remove the tube and discard it.  Simply remove the tape that secures the flexible plastic drainage tube to the bandage and briskly pull the tube straight out.  You will likely feel a little discomfort during this process, but the tube should slide out as it is not sewn or otherwise secured directly to your body.  It is merely held in place by the bandage itself.  °                           ° °If you are not clear about when your first post-op appointment is scheduled, please call the office on the next business day to inquire about it.  Please also call the office if you have any other questions ([phone]).   ° ° ° °Post Anesthesia Home Care Instructions ° °Activity: °Get plenty of rest for the remainder of the day. A responsible adult should stay with you for 24 hours following the procedure.  °For the next 24 hours, DO NOT: °-Drive a car °-Operate machinery °-Drink alcoholic beverages °-Take any medication unless instructed by your physician °-Make any legal   decisions or sign important papers. ° °Meals: °Start with liquid foods such as gelatin or soup. Progress to regular foods as tolerated. Avoid greasy, spicy, heavy foods. If nausea and/or vomiting occur, drink only clear liquids until the nausea and/or vomiting subsides. Call your physician if vomiting continues. ° °Special Instructions/Symptoms: °Your throat may feel dry or sore from the anesthesia or the breathing tube placed in your throat during surgery. If this causes discomfort, gargle with warm salt water. The discomfort should disappear within 24 hours. ° °If you had a scopolamine patch placed behind your ear for the management of post- operative nausea and/or vomiting: ° °1. The medication in the patch is effective  for 72 hours, after which it should be removed.  Wrap patch in a tissue and discard in the trash. Wash hands thoroughly with soap and water. °2. You may remove the patch earlier than 72 hours if you experience unpleasant side effects which may include dry mouth, dizziness or visual disturbances. °3. Avoid touching the patch. Wash your hands with soap and water after contact with the patch. °  ° °

## 2015-01-17 NOTE — Anesthesia Postprocedure Evaluation (Signed)
  Anesthesia Post-op Note  Patient: Dana Salazar  Procedure(s) Performed: Procedure(s): LEFT CARPAL TUNNEL RELEASE ENDOSCOPIC (Left)  Patient Location: PACU  Anesthesia Type:MAC  Level of Consciousness: awake and alert   Airway and Oxygen Therapy: Patient Spontanous Breathing  Post-op Pain: none  Post-op Assessment: Post-op Vital signs reviewed  Post-op Vital Signs: stable  Last Vitals:  Filed Vitals:   01/17/15 0848  BP: 123/75  Pulse: 51  Temp: 36.5 C  Resp: 18    Complications: No apparent anesthesia complications

## 2015-01-17 NOTE — Transfer of Care (Signed)
Immediate Anesthesia Transfer of Care Note  Patient: Dana Salazar  Procedure(s) Performed: Procedure(s): LEFT CARPAL TUNNEL RELEASE ENDOSCOPIC (Left)  Patient Location: PACU  Anesthesia Type:MAC  Level of Consciousness: awake, alert , oriented and patient cooperative  Airway & Oxygen Therapy: Patient Spontanous Breathing  Post-op Assessment: Report given to RN and Post -op Vital signs reviewed and stable  Post vital signs: Reviewed and stable  Last Vitals:  Filed Vitals:   01/17/15 0625  BP: 110/90  Pulse: 67  Temp: 36.6 C  Resp: 20    Complications: No apparent anesthesia complications

## 2015-01-17 NOTE — Anesthesia Procedure Notes (Signed)
Procedure Name: MAC Date/Time: 01/17/2015 7:40 AM Performed by: Ireland Virrueta D Pre-anesthesia Checklist: Patient identified, Emergency Drugs available, Suction available, Patient being monitored and Timeout performed Patient Re-evaluated:Patient Re-evaluated prior to inductionOxygen Delivery Method: Simple face mask

## 2015-01-17 NOTE — Interval H&P Note (Signed)
History and Physical Interval Note:  01/17/2015 7:42 AM  Dana Salazar  has presented today for surgery, with the diagnosis of LEFT CARPAL TUNNEL SYNDROME  The various methods of treatment have been discussed with the patient and family. After consideration of risks, benefits and other options for treatment, the patient has consented to  Procedure(s): LEFT CARPAL TUNNEL RELEASE ENDOSCOPIC (Left) as a surgical intervention .  The patient's history has been reviewed, patient examined, no change in status, stable for surgery.  I have reviewed the patient's chart and labs.  Questions were answered to the patient's satisfaction.     [name]

## 2015-01-17 NOTE — Anesthesia Preprocedure Evaluation (Addendum)
Anesthesia Evaluation  Patient identified by MRN, date of birth, ID band Patient awake    Reviewed: Allergy & Precautions, NPO status   History of Anesthesia Complications Negative for: history of anesthetic complications  Airway Mallampati: II       Dental  (+) Teeth Intact   Pulmonary neg pulmonary ROS,  breath sounds clear to auscultation        Cardiovascular negative cardio ROS  Rhythm:Regular Rate:Normal     Neuro/Psych  Headaches, PSYCHIATRIC DISORDERS Anxiety Depression Hx of Guillan-Barre syndrome    GI/Hepatic Neg liver ROS, GERD-  Medicated,  Endo/Other  negative endocrine ROS  Renal/GU negative Renal ROS     Musculoskeletal  (+) Arthritis -,   Abdominal   Peds  Hematology negative hematology ROS (+)   Anesthesia Other Findings   Reproductive/Obstetrics                           Anesthesia Physical Anesthesia Plan  ASA: I  Anesthesia Plan: MAC   Post-op Pain Management:    Induction: Intravenous  Airway Management Planned: Natural Airway  Additional Equipment:   Intra-op Plan:   Post-operative Plan:   Informed Consent: I have reviewed the patients History and Physical, chart, labs and discussed the procedure including the risks, benefits and alternatives for the proposed anesthesia with the patient or authorized representative who has indicated his/her understanding and acceptance.     Plan Discussed with: CRNA and Surgeon  Anesthesia Plan Comments:         Anesthesia Quick Evaluation

## 2015-01-17 NOTE — Op Note (Signed)
01/17/2015  7:42 AM  PATIENT:  Dana Salazar  44 y.o. female  PRE-OPERATIVE DIAGNOSIS:  LEFT CARPAL TUNNEL SYNDROME  POST-OPERATIVE DIAGNOSIS:  Same  PROCEDURE:  Procedure(s): LEFT CARPAL TUNNEL RELEASE ENDOSCOPIC  SURGEON:  Surgeon(s): Mack Hookavid Toiya Morrish, MD  PHYSICIAN ASSISTANT: Danielle RankinKirsten Schrader, OPA-C  ANESTHESIA:  Local / MAC  SPECIMENS:  None  DRAINS:   TLS x 1  EBL:  less than 50 mL  PREOPERATIVE INDICATIONS:  Dana Heirashia K Langer is a  44 y.o. female with a diagnosis of LEFT CARPAL TUNNEL SYNDROME who failed conservative measures and elected for surgical management.    The risks benefits and alternatives were discussed with the patient preoperatively including but not limited to the risks of infection, bleeding, nerve injury, cardiopulmonary complications, the need for revision surgery, among others, and the patient verbalized understanding and consented to proceed.  OPERATIVE IMPLANTS: None  OPERATIVE FINDINGS: Tight carpal tunnel, acceptably decompressed following release  OPERATIVE PROCEDURE:  After receiving prophylactic antibiotics, the patient was escorted to the operative theatre and placed in a supine position.  A surgical "time-out" was performed during which the planned procedure, proposed operative site, and the correct patient identity were compared to the operative consent and agreement confirmed by the circulating nurse according to current facility policy.  Following application of a tourniquet to the operative extremity, the planned incisions were marked and anesthetized with a mixture of lidocaine & bupivicaine containing epinephrine.  The exposed skin was prepped with Chloraprep and draped in the usual sterile fashion.  The limb was exsanguinated with an Esmarch bandage and the tourniquet inflated to approximately 100mmHg higher than systolic BP.   The incisions were made sharply. Subcutaneous tissues were dissected with blunt and spreading dissection. At the  proximal incision, the deep forearm fascia was split in line with the skin incision the distal edge grasped with a hemostat. At the mid palmar incision, the palmar fascia was split in line with the skin incision, revealing the underlying superficial palmar arch which was visualized with loupe assisted magnification. The synovial reflector was then introduced into the proximal incision and passed through the carpal canal, uses to reflect synovium from the deep surface of the transverse carpal ligament. It was removed and replaced with the slotted cannula and blunt obturator, passing from proximal to distal and exiting the distal wound superficial to the superficial palmar arch. The obturator was removed and the camera inserted. Visualization of the ligament was acceptable. The triangle shape blade was then inserted distally, advanced to the midportion of the ligament, and there used to create a perforation in the ligament. This instrument was removed and the hooked nstrument was inserted. It was placed into the perforation in the ligament and withdrawn distally, completing transection of the distal half of the ligament. The camera was then removed and placed into the distal end of the cannula. The hooked instrument was placed into the proximal end and advanced facility to be placed into the apex of the V. which had been formed to the distal hemi-transection of the ligament. It was withdrawn proximally, completing transection of the ligament. The adequacy of the release was judged with the scope and the instruments used as a probe. All of the endoscopic instruments are removed and the adequacy of release was again judged from the proximal incision perspective with direct loupe assisted visualization. In addition, the proximal forearm fascia was split for 2 inches proximal to the proximal incision under direct visualization using a sliding scissor technique. The tourniquet  was released, the wound copiously irrigated. A  TLS drain was placed to lie alongside the nerve exiting its own skin hole distally made with some sharp trocar. The skin was then closed with 4-0 Vicryl Rapide interrupted sutures. A light dressing was applied and the balance of 10 mL of the initial anesthetic mixture was placed down the drain, and the drain was clamped to be unclamped in the recovery room.  DISPOSITION:  The patient will be discharged home today, returning in 10-15 days for re-assessment.

## 2015-01-18 ENCOUNTER — Encounter (HOSPITAL_BASED_OUTPATIENT_CLINIC_OR_DEPARTMENT_OTHER): Payer: Self-pay | Admitting: Orthopedic Surgery

## 2015-01-24 ENCOUNTER — Encounter: Payer: Commercial Managed Care - HMO | Admitting: Neurology

## 2015-02-01 DIAGNOSIS — G5601 Carpal tunnel syndrome, right upper limb: Secondary | ICD-10-CM | POA: Diagnosis not present

## 2015-02-01 DIAGNOSIS — G5602 Carpal tunnel syndrome, left upper limb: Secondary | ICD-10-CM | POA: Diagnosis not present

## 2015-02-17 DIAGNOSIS — G5601 Carpal tunnel syndrome, right upper limb: Secondary | ICD-10-CM | POA: Diagnosis not present

## 2015-02-17 DIAGNOSIS — G5602 Carpal tunnel syndrome, left upper limb: Secondary | ICD-10-CM | POA: Diagnosis not present

## 2015-02-18 DIAGNOSIS — R350 Frequency of micturition: Secondary | ICD-10-CM | POA: Diagnosis not present

## 2015-02-18 DIAGNOSIS — N3020 Other chronic cystitis without hematuria: Secondary | ICD-10-CM | POA: Diagnosis not present

## 2015-03-01 ENCOUNTER — Telehealth: Payer: Self-pay | Admitting: *Deleted

## 2015-03-01 NOTE — Telephone Encounter (Signed)
Pt states that she was returning a call. No message was left

## 2015-03-01 NOTE — Telephone Encounter (Signed)
I called the patient. She accidentally cancelled her EMG/NCV for 6/30. Unfortunately, her appointment has already been taken. She then requested to wait until after her carpel tunnel surgery. EMG/NCV scheduled for 8/15.

## 2015-03-02 DIAGNOSIS — N3020 Other chronic cystitis without hematuria: Secondary | ICD-10-CM | POA: Diagnosis not present

## 2015-03-02 DIAGNOSIS — R350 Frequency of micturition: Secondary | ICD-10-CM | POA: Diagnosis not present

## 2015-03-02 DIAGNOSIS — R319 Hematuria, unspecified: Secondary | ICD-10-CM | POA: Diagnosis not present

## 2015-03-03 ENCOUNTER — Encounter: Payer: Commercial Managed Care - HMO | Admitting: Neurology

## 2015-03-04 DIAGNOSIS — N3020 Other chronic cystitis without hematuria: Secondary | ICD-10-CM | POA: Diagnosis not present

## 2015-03-04 DIAGNOSIS — N359 Urethral stricture, unspecified: Secondary | ICD-10-CM | POA: Diagnosis not present

## 2015-03-04 DIAGNOSIS — R312 Other microscopic hematuria: Secondary | ICD-10-CM | POA: Diagnosis not present

## 2015-03-04 DIAGNOSIS — K760 Fatty (change of) liver, not elsewhere classified: Secondary | ICD-10-CM | POA: Diagnosis not present

## 2015-03-04 DIAGNOSIS — K449 Diaphragmatic hernia without obstruction or gangrene: Secondary | ICD-10-CM | POA: Diagnosis not present

## 2015-03-04 DIAGNOSIS — K5730 Diverticulosis of large intestine without perforation or abscess without bleeding: Secondary | ICD-10-CM | POA: Diagnosis not present

## 2015-03-07 DIAGNOSIS — H5213 Myopia, bilateral: Secondary | ICD-10-CM | POA: Diagnosis not present

## 2015-03-07 DIAGNOSIS — H5210 Myopia, unspecified eye: Secondary | ICD-10-CM | POA: Diagnosis not present

## 2015-03-11 DIAGNOSIS — G5601 Carpal tunnel syndrome, right upper limb: Secondary | ICD-10-CM | POA: Diagnosis not present

## 2015-03-11 HISTORY — PX: CARPAL TUNNEL RELEASE: SHX101

## 2015-03-15 ENCOUNTER — Other Ambulatory Visit: Payer: Self-pay | Admitting: Urology

## 2015-03-21 DIAGNOSIS — G5602 Carpal tunnel syndrome, left upper limb: Secondary | ICD-10-CM | POA: Diagnosis not present

## 2015-04-01 IMAGING — CT CT NECK W/ CM
4 series · 16 of 33 positions shown, 19 images · IV contrast (Omnipaque 300)
Comparison: None

CLINICAL DATA: Sore neck with tenderness and swelling, adenopathy
for 6 weeks, past history Ilia

EXAM:
CT NECK WITH CONTRAST
TECHNIQUE: Multidetector CT imaging of the neck was performed using the
standard protocol following the bolus administration of intravenous
contrast. Sagittal and coronal MPR images reconstructed from axial
data set.
CONTRAST:  80mL OMNIPAQUE IOHEXOL 300 MG/ML  SOLN IV

[Series 2: soft tissue neck 2.0 b31s · axial · 0.46mm/px · z∈[-807,-671]mm · 5 of 102 slices shown, 7 images]
[im 17/102  soft-tissue]
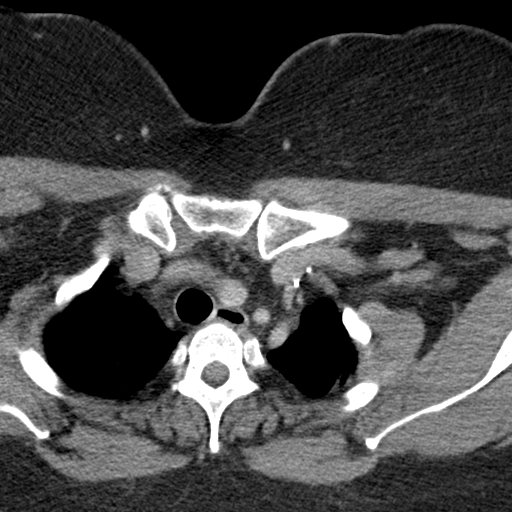
[im 17/102  bone]
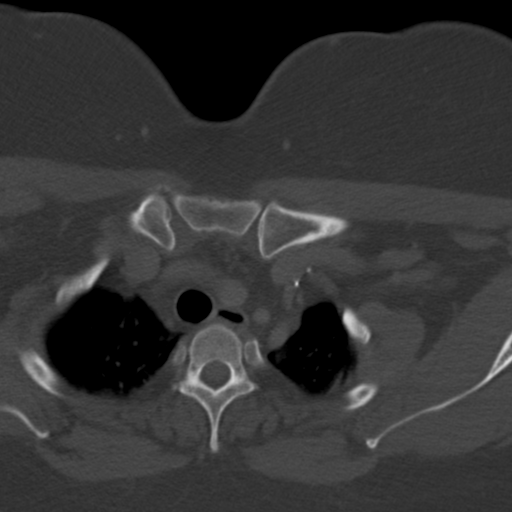
[im 34/102  bone]
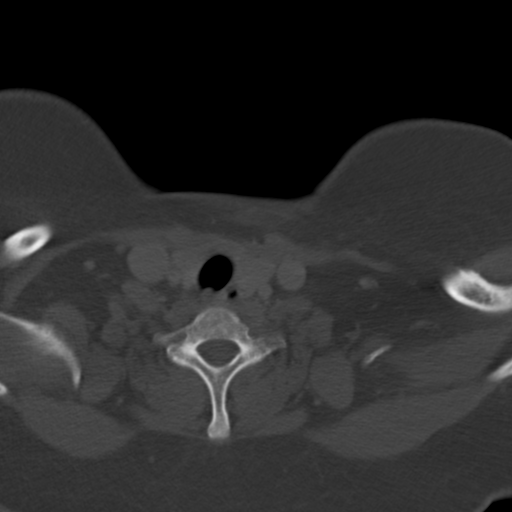
[im 51/102  bone]
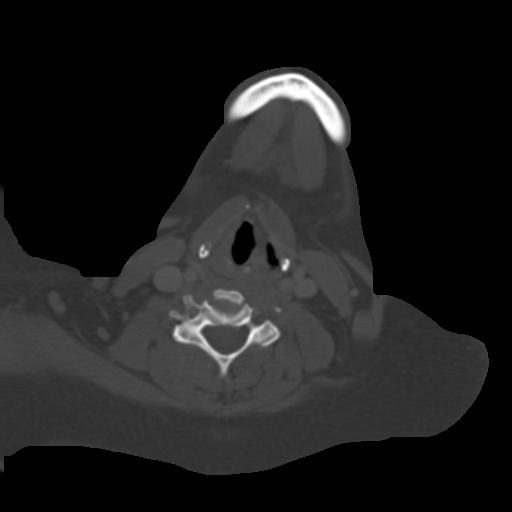
[im 68/102  bone]
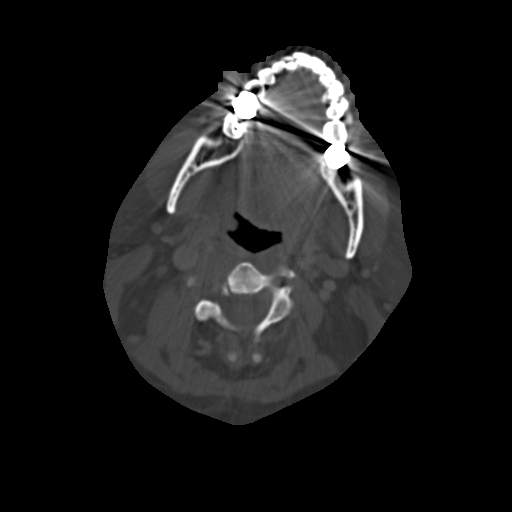
[im 85/102  soft-tissue]
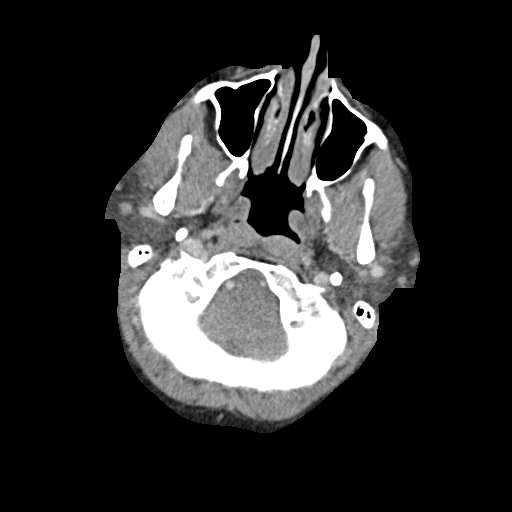
[im 85/102  bone]
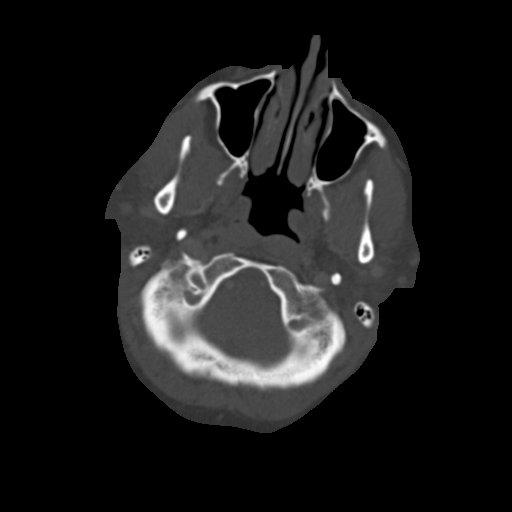

[Series 4: neck 2.0 soft tissue sag · sagittal · 0.40mm/px · 5 of 93 slices shown, 6 images]
[im 31/93  bone]
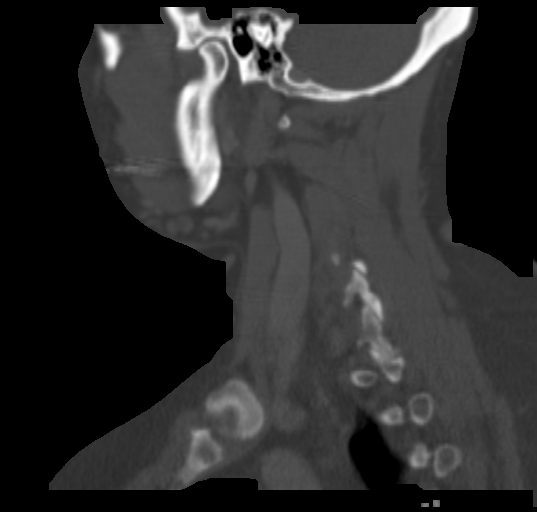
[im 39/93  bone]
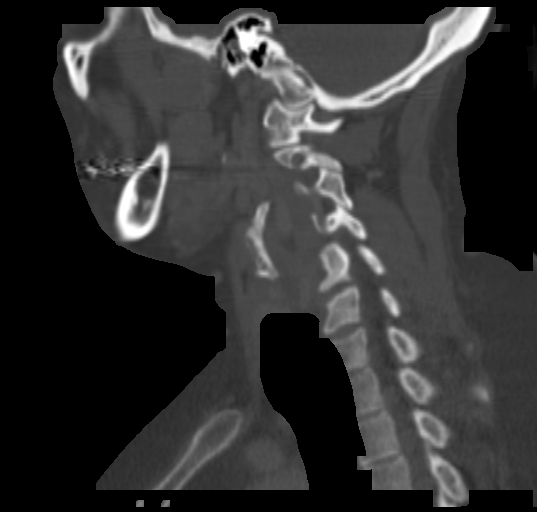
[im 47/93  soft-tissue]
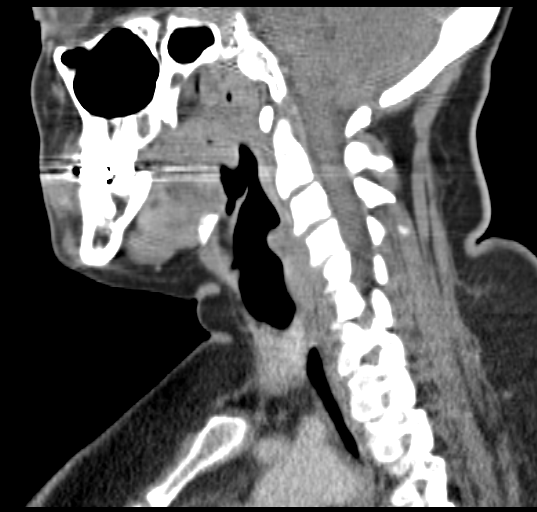
[im 47/93  bone]
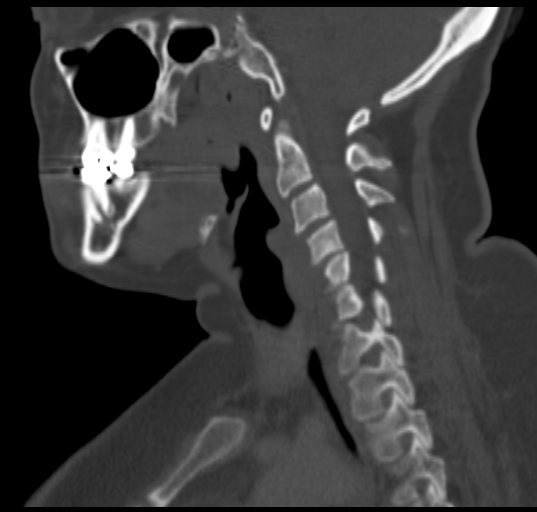
[im 54/93  bone]
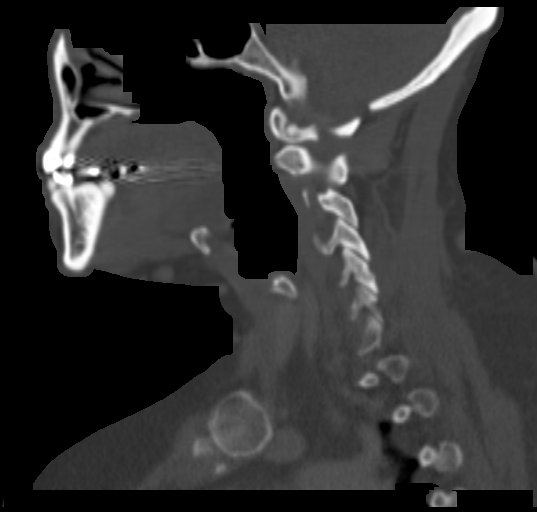
[im 62/93  bone]
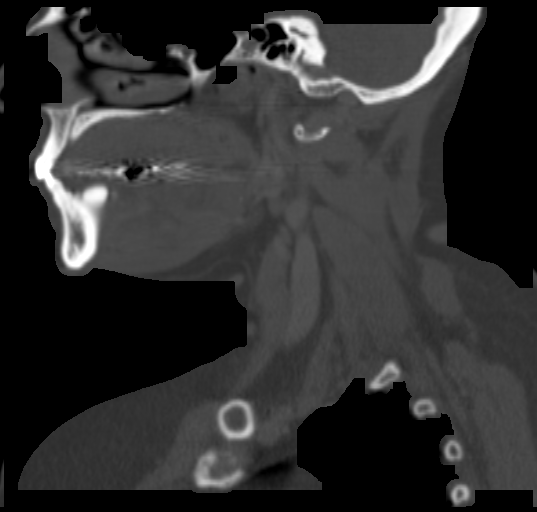

[Series 5: neck 2.0 soft tissue coro · coronal · 0.38mm/px · 3 of 106 slices shown]
[im 22/106  bone]
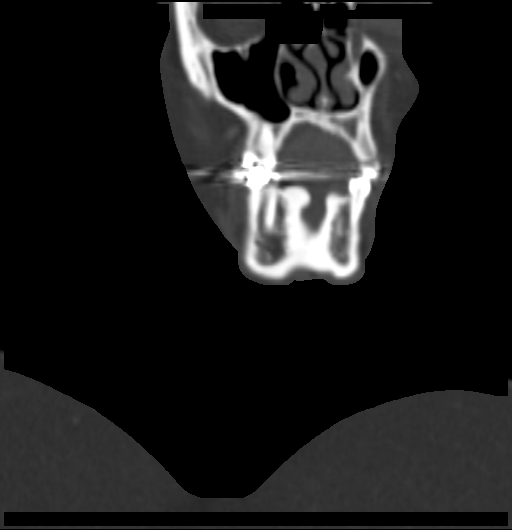
[im 43/106  bone]
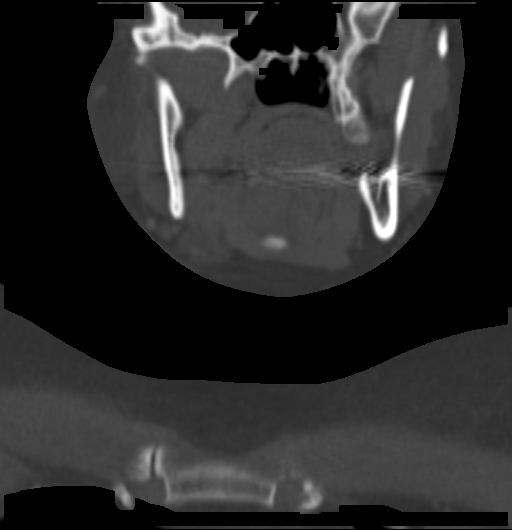
[im 64/106  bone]
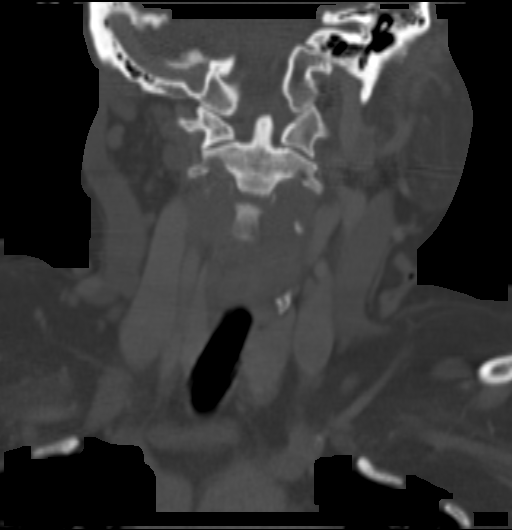

[Series 6: axial soft tissue neck 2.0 · axial · 0.40mm/px · z∈[-806,-738]mm · 3 of 101 slices shown]
[im 17/101  soft-tissue]
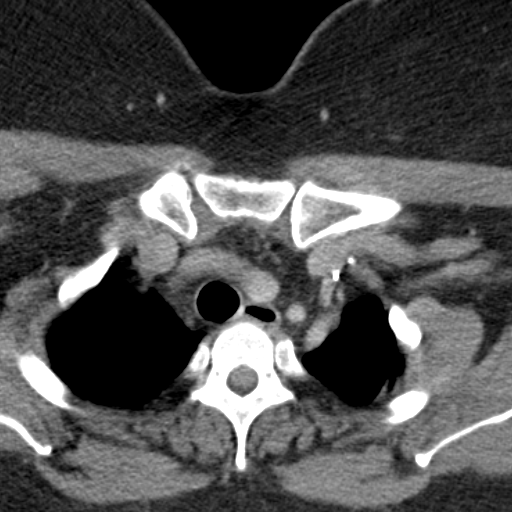
[im 34/101  soft-tissue]
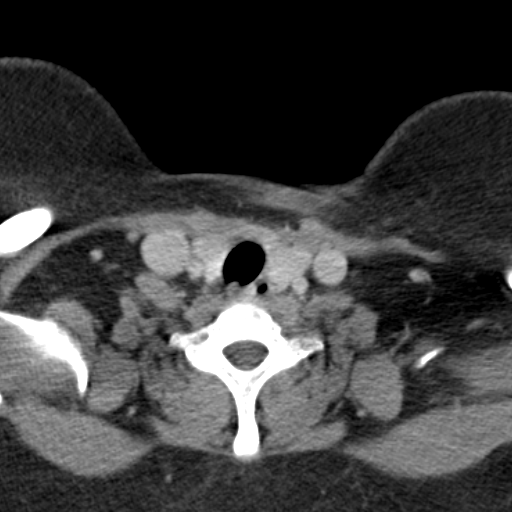
[im 51/101  soft-tissue]
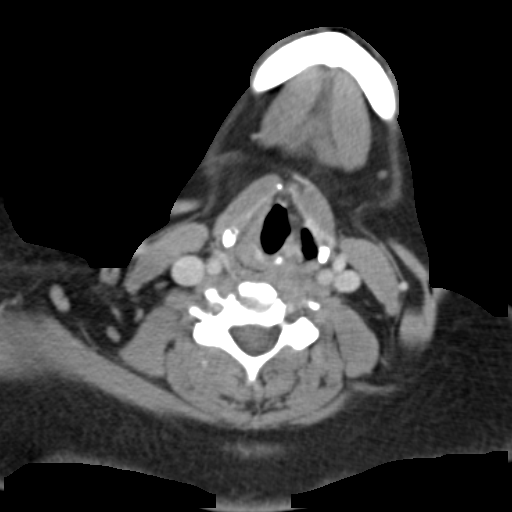

[16 of 33 positions shown; findings below may reference images not displayed]

FINDINGS: Visualized intracranial contents unremarkable.

Skullbase unremarkable with visualized sinuses, mastoid air cells
and middle ear cavities clear.

Scattered normal size cervical lymph nodes bilaterally without
discrete adenopathy.

No cervical mass or abnormal fluid collection identified.

Small intra parotid lymph nodes bilaterally with otherwise normal
appearing thyroid, submandibular, and parotid glands.

Vascular structures patent.

Prevertebral soft tissues normal thickness.

Lung apices clear.

Osseous structures unremarkable.
IMPRESSION: Unremarkable CT neck demonstrating no acute abnormalities.

Specifically, no cervical adenopathy, mass or abnormal fluid
collection identified.

## 2015-04-04 DIAGNOSIS — R8290 Unspecified abnormal findings in urine: Secondary | ICD-10-CM | POA: Diagnosis not present

## 2015-04-04 DIAGNOSIS — J309 Allergic rhinitis, unspecified: Secondary | ICD-10-CM | POA: Diagnosis not present

## 2015-04-04 DIAGNOSIS — J069 Acute upper respiratory infection, unspecified: Secondary | ICD-10-CM | POA: Diagnosis not present

## 2015-04-04 DIAGNOSIS — R300 Dysuria: Secondary | ICD-10-CM | POA: Diagnosis not present

## 2015-04-06 ENCOUNTER — Encounter (HOSPITAL_BASED_OUTPATIENT_CLINIC_OR_DEPARTMENT_OTHER): Payer: Self-pay | Admitting: *Deleted

## 2015-04-06 NOTE — Progress Notes (Signed)
To [facility] at 1000-Hgb on arrival-instructed Npo after  Mn.

## 2015-04-11 NOTE — H&P (Signed)
History of Present Illness   Dana Salazar at baseline voids twice an hour due to fullness and pressure but not true pain, and she is not sexually active. She gets up twice at night and is continent. She has never smoked. She denies gross hematuria and history of kidney stones, does not take blood thinners or aspirin.  She said her bladder became more frequently when she was given ciprofloxacin, but the frequency really has not improved, and she even had called for a 2nd course of antibiotics by her primary physician.    When I saw Dana Hay in June 2016 I noted she failed VESIcare in the past and she had recurrent urinary tract infections. Dilation and hydrodistention were discussed in the distant past. I was going to examine her and I was assuming I was not going to be able to perform a cystoscopy because of her urethral stenosis.   CT scan today was normal.   Urine culture from February 18, 2015, was negative.   Review of Systems: No change in bowel or neurologic systems.   Frequency: Stable.    Past Medical History Problems  1. History of Anxiety (F41.9) 2. History of arthritis (Z87.39) 3. History of depression (Z86.59) 4. History of gastroesophageal reflux (GERD) (Z87.19) 5. History of hypercholesterolemia (Z86.39)  Current Meds 1. Omeprazole 40 MG Oral Capsule Delayed Release;  Therapy: 03May2016 to Recorded 2. Simvastatin 20 MG Oral Tablet;  Therapy: 03May2016 to Recorded  Allergies Medication  1. Flagyl TABS 2. Sulfa Drugs  Family History Problems  1. Family history of Blood in urine : Mother 2. Family history of Death of family member : Father   father passed @ age 21-cardiac arrest 3. Family history of cardiac arrest (Z82.49) : Father 4. Family history of cardiac disorder (Z82.49) : Father 5. Family history of hypertension (Z82.49) : Mother  Social History Problems  1. Denied: History of Alcohol use 2. Caffeine use (F15.90)   2 drinks daily 3. Never  smoker 4. Non-smoker (Z78.9) 5. Occupation   unemployed 6. Single  Assessment Assessed  1. Chronic cystitis (N30.20) 2. Urethral stricture (N35.9)  Plan Chronic cystitis  1. Follow-up Schedule Surgery Office  Follow-up  Status: Complete  Done: 01Jul2016  Discussion/Summary   Today Dana Mcsorley had burning. It is not settling down. She is currently on ciprofloxacin. She has a yeast tablet from her primary care doctor as well.   We had a lengthy discussion on chronic cystitis and pathophysiology and a treatment plan where she could use watchful waiting and prophylaxis and "see how she does." We had a lengthy discussion on the rare risk of bladder cancer and not doing a cystoscopy at least now. We talked about how she could have interstitial cystitis and a hydrodistention.   We talked about cystoscopy/hydrodistention and instillation in detail. Pros, cons, general surgical and anesthetic risks, and other options including watchful waiting were discussed. Risks were described but not limited to pain, infection, and bleeding. The risk of bladder perforation and management were discussed. The patient understands that it is primarily a diagnostic procedure.   We both decided, especially with Wednesday, for her to have a urethral dilation under anesthesia and a hydrodistention and rule out any bladder abnormalities. I may put her on urinary prophylaxis in the future. I will get urine cultures moving forward. Her last culture was negative.   There are no other modifying factors, associated signs or symptoms. There are no aggravating or relieving factors. Presentation is from Wednesday moderate  in severity and ongoing.   After a thorough review of the management options for the patient's condition the patient  elected to proceed with surgical therapy as noted above. We have discussed the potential benefits and risks of the procedure, side effects of the proposed treatment, the likelihood of the  patient achieving the goals of the procedure, and any potential problems that might occur during the procedure or recuperation. Informed consent has been obtained.

## 2015-04-12 ENCOUNTER — Encounter (HOSPITAL_BASED_OUTPATIENT_CLINIC_OR_DEPARTMENT_OTHER): Payer: Self-pay | Admitting: Anesthesiology

## 2015-04-12 ENCOUNTER — Ambulatory Visit (HOSPITAL_BASED_OUTPATIENT_CLINIC_OR_DEPARTMENT_OTHER): Payer: Commercial Managed Care - HMO | Admitting: Anesthesiology

## 2015-04-12 ENCOUNTER — Encounter (HOSPITAL_BASED_OUTPATIENT_CLINIC_OR_DEPARTMENT_OTHER)
Admission: RE | Disposition: A | Discharge status: Home / Self Care | Payer: Self-pay | Source: Ambulatory Visit | Attending: Urology

## 2015-04-12 ENCOUNTER — Ambulatory Visit (HOSPITAL_BASED_OUTPATIENT_CLINIC_OR_DEPARTMENT_OTHER)
Admission: RE | Admit: 2015-04-12 | Discharge: 2015-04-12 | Disposition: A | Discharge status: Home / Self Care | Payer: Commercial Managed Care - HMO | Source: Ambulatory Visit | Attending: Urology | Admitting: Urology

## 2015-04-12 DIAGNOSIS — N359 Urethral stricture, unspecified: Secondary | ICD-10-CM | POA: Insufficient documentation

## 2015-04-12 DIAGNOSIS — E780 Pure hypercholesterolemia: Secondary | ICD-10-CM | POA: Diagnosis not present

## 2015-04-12 DIAGNOSIS — N3021 Other chronic cystitis with hematuria: Secondary | ICD-10-CM | POA: Diagnosis not present

## 2015-04-12 DIAGNOSIS — Z6836 Body mass index (BMI) 36.0-36.9, adult: Secondary | ICD-10-CM | POA: Diagnosis not present

## 2015-04-12 DIAGNOSIS — K219 Gastro-esophageal reflux disease without esophagitis: Secondary | ICD-10-CM | POA: Diagnosis not present

## 2015-04-12 DIAGNOSIS — M1990 Unspecified osteoarthritis, unspecified site: Secondary | ICD-10-CM | POA: Diagnosis not present

## 2015-04-12 DIAGNOSIS — R3915 Urgency of urination: Secondary | ICD-10-CM | POA: Diagnosis present

## 2015-04-12 DIAGNOSIS — Z842 Family history of other diseases of the genitourinary system: Secondary | ICD-10-CM | POA: Insufficient documentation

## 2015-04-12 DIAGNOSIS — Z79899 Other long term (current) drug therapy: Secondary | ICD-10-CM | POA: Insufficient documentation

## 2015-04-12 DIAGNOSIS — R102 Pelvic and perineal pain: Secondary | ICD-10-CM | POA: Diagnosis not present

## 2015-04-12 HISTORY — PX: CYSTOSCOPY WITH URETHRAL DILATATION: SHX5125

## 2015-04-12 HISTORY — PX: CYSTO WITH HYDRODISTENSION: SHX5453

## 2015-04-12 LAB — POCT HEMOGLOBIN-HEMACUE: Hemoglobin: 14.1 g/dL (ref 12.0–15.0)

## 2015-04-12 SURGERY — CYSTOSCOPY, WITH BLADDER HYDRODISTENSION
Anesthesia: General | Site: Bladder

## 2015-04-12 MED ORDER — LACTATED RINGERS IV SOLN
INTRAVENOUS | Status: DC
  Administered 2015-04-12: 11:00:00 via INTRAVENOUS
  Filled 2015-04-12: qty 1000

## 2015-04-12 MED ORDER — MIDAZOLAM HCL 5 MG/5ML IJ SOLN
INTRAMUSCULAR | Status: DC | PRN
  Administered 2015-04-12: 2 mg via INTRAVENOUS

## 2015-04-12 MED ORDER — KETOROLAC TROMETHAMINE 30 MG/ML IJ SOLN
INTRAMUSCULAR | Status: DC | PRN
  Administered 2015-04-12: 30 mg via INTRAVENOUS

## 2015-04-12 MED ORDER — CIPROFLOXACIN IN D5W 400 MG/200ML IV SOLN
400.0000 mg | INTRAVENOUS | Status: AC
  Administered 2015-04-12: 400 mg via INTRAVENOUS
  Filled 2015-04-12: qty 200

## 2015-04-12 MED ORDER — HYDROCODONE-ACETAMINOPHEN 5-325 MG PO TABS: 1.0000 | ORAL_TABLET | Freq: Four times a day (QID) | ORAL | Status: AC | PRN

## 2015-04-12 MED ORDER — FENTANYL CITRATE (PF) 100 MCG/2ML IJ SOLN
INTRAMUSCULAR | Status: DC | PRN
  Administered 2015-04-12 (×2): 50 ug via INTRAVENOUS

## 2015-04-12 MED ORDER — ACETAMINOPHEN 10 MG/ML IV SOLN
INTRAVENOUS | Status: DC | PRN
  Administered 2015-04-12: 1000 mg via INTRAVENOUS

## 2015-04-12 MED ORDER — MIDAZOLAM HCL 2 MG/2ML IJ SOLN
INTRAMUSCULAR | Status: AC
  Filled 2015-04-12: qty 2

## 2015-04-12 MED ORDER — ONDANSETRON HCL 4 MG/2ML IJ SOLN
INTRAMUSCULAR | Status: DC | PRN
Start: 2015-04-12 — End: 2015-04-12
  Administered 2015-04-12: 4 mg via INTRAVENOUS

## 2015-04-12 MED ORDER — PROPOFOL 10 MG/ML IV BOLUS
INTRAVENOUS | Status: DC | PRN
  Administered 2015-04-12: 200 mg via INTRAVENOUS

## 2015-04-12 MED ORDER — BUPIVACAINE HCL (PF) 0.5 % IJ SOLN
INTRAMUSCULAR | Status: DC | PRN
  Administered 2015-04-12: 12:00:00 via INTRAVESICAL

## 2015-04-12 MED ORDER — CIPROFLOXACIN IN D5W 400 MG/200ML IV SOLN
INTRAVENOUS | Status: AC
  Filled 2015-04-12: qty 200

## 2015-04-12 MED ORDER — FENTANYL CITRATE (PF) 100 MCG/2ML IJ SOLN
INTRAMUSCULAR | Status: AC
  Filled 2015-04-12: qty 2

## 2015-04-12 MED ORDER — LIDOCAINE HCL (CARDIAC) 20 MG/ML IV SOLN
INTRAVENOUS | Status: DC | PRN
  Administered 2015-04-12: 80 mg via INTRAVENOUS

## 2015-04-12 MED ORDER — STERILE WATER FOR IRRIGATION IR SOLN
Status: DC | PRN
  Administered 2015-04-12: 3000 mL

## 2015-04-12 MED ORDER — CIPROFLOXACIN HCL 500 MG PO TABS: 500.0000 mg | ORAL_TABLET | Freq: Two times a day (BID) | ORAL | Status: AC

## 2015-04-12 MED ORDER — IOHEXOL 350 MG/ML SOLN
INTRAVENOUS | Status: DC | PRN
  Administered 2015-04-12: 16 mL

## 2015-04-12 MED ORDER — DEXAMETHASONE SODIUM PHOSPHATE 4 MG/ML IJ SOLN
INTRAMUSCULAR | Status: DC | PRN
  Administered 2015-04-12: 10 mg via INTRAVENOUS

## 2015-04-12 SURGICAL SUPPLY — 39 items
ADAPTER CATH URET PLST 4-6FR (CATHETERS) IMPLANT
ADPR CATH URET STRL DISP 4-6FR (CATHETERS)
BAG DRAIN URO-CYSTO SKYTR STRL (DRAIN) ×3 IMPLANT
BAG DRN UROCATH (DRAIN) ×1
BALLN NEPHROSTOMY (BALLOONS) ×3
BALLOON NEPHROSTOMY (BALLOONS) IMPLANT
CANISTER SUCT LVC 12 LTR MEDI- (MISCELLANEOUS) IMPLANT
CATH FOLEY 2WAY SLVR  5CC 18FR (CATHETERS)
CATH FOLEY 2WAY SLVR 5CC 18FR (CATHETERS) IMPLANT
CATH INTERMIT  6FR 70CM (CATHETERS) ×2 IMPLANT
CATH ROBINSON RED A/P 12FR (CATHETERS) IMPLANT
CATH ROBINSON RED A/P 14FR (CATHETERS) ×3 IMPLANT
CATH URET 5FR 28IN CONE TIP (BALLOONS)
CATH URET 5FR 70CM CONE TIP (BALLOONS) IMPLANT
CLOTH BEACON ORANGE TIMEOUT ST (SAFETY) ×3 IMPLANT
COVER LIGHT HANDLE  1/PK (MISCELLANEOUS) ×2
COVER LIGHT HANDLE 1/PK (MISCELLANEOUS) IMPLANT
ELECT REM PT RETURN 9FT ADLT (ELECTROSURGICAL)
ELECTRODE REM PT RTRN 9FT ADLT (ELECTROSURGICAL) IMPLANT
GLOVE BIO SURGEON STRL SZ 6.5 (GLOVE) ×1 IMPLANT
GLOVE BIO SURGEON STRL SZ7 (GLOVE) ×2 IMPLANT
GLOVE BIO SURGEON STRL SZ7.5 (GLOVE) ×3 IMPLANT
GLOVE BIO SURGEONS STRL SZ 6.5 (GLOVE) ×1
GLOVE BIOGEL PI IND STRL 6.5 (GLOVE) IMPLANT
GLOVE BIOGEL PI INDICATOR 6.5 (GLOVE) ×4
GOWN STRL REUS W/ TWL LRG LVL3 (GOWN DISPOSABLE) ×1 IMPLANT
GOWN STRL REUS W/ TWL XL LVL3 (GOWN DISPOSABLE) ×1 IMPLANT
GOWN STRL REUS W/TWL LRG LVL3 (GOWN DISPOSABLE) ×3
GOWN STRL REUS W/TWL XL LVL3 (GOWN DISPOSABLE) ×6
GUIDEWIRE STR DUAL SENSOR (WIRE) ×3 IMPLANT
MANIFOLD NEPTUNE II (INSTRUMENTS) IMPLANT
NDL SAFETY ECLIPSE 18X1.5 (NEEDLE) ×1 IMPLANT
NEEDLE HYPO 18GX1.5 SHARP (NEEDLE) ×3
NS IRRIG 500ML POUR BTL (IV SOLUTION) IMPLANT
PACK CYSTO (CUSTOM PROCEDURE TRAY) ×3 IMPLANT
SUT SILK 0 TIES 10X30 (SUTURE) IMPLANT
SYR 20CC LL (SYRINGE) ×3 IMPLANT
SYRINGE IRR TOOMEY STRL 70CC (SYRINGE) IMPLANT
WATER STERILE IRR 3000ML UROMA (IV SOLUTION) ×3 IMPLANT

## 2015-04-12 NOTE — Transfer of Care (Signed)
Immediate Anesthesia Transfer of Care Note  Patient: Dana Salazar  Procedure(s) Performed: Procedure(s): CYSTO, HYDRODISTENSION, MARCAINE AND PYRIDIUM (N/A) CYSTOSCOPY WITH URETHRAL  BALLOON DILATATION (N/A)  Patient Location: PACU  Anesthesia Type:General  Level of Consciousness: awake, alert  and oriented  Airway & Oxygen Therapy: Patient Spontanous Breathing and Patient connected to nasal cannula oxygen  Post-op Assessment: Report given to RN  Post vital signs: Reviewed and stable  Last Vitals:  Filed Vitals:   04/12/15 1014  BP: 116/75  Pulse: 72  Temp: 37.1 C  Resp: 18    Complications: No apparent anesthesia complications

## 2015-04-12 NOTE — Interval H&P Note (Signed)
History and Physical Interval Note:  04/12/2015 7:11 AM  Dana Salazar  has presented today for surgery, with the diagnosis of PELVIC PAIN, URETHRAL STRICTURE  The various methods of treatment have been discussed with the patient and family. After consideration of risks, benefits and other options for treatment, the patient has consented to  Procedure(s): CYSTO, HYDRODISTENSION, MARCAINE AND PYRIDIUM (N/A) CYSTOSCOPY WITH URETHRAL DILATATION (N/A) as a surgical intervention .  The patient's history has been reviewed, patient examined, no change in status, stable for surgery.  I have reviewed the patient's chart and labs.  Questions were answered to the patient's satisfaction.     Albin Duckett A

## 2015-04-12 NOTE — Discharge Instructions (Addendum)
1. You may see some blood in the urine and may have some burning with urination for 48-72 hours. You also may notice that you have to urinate more frequently or urgently after your procedure which is normal.  2. You should call should you develop an inability urinate, fever > 101, persistent nausea and vomiting that prevents you from eating or drinking to stay hydrated.  3. If you have a stent, you will likely urinate more frequently and urgently until the stent is removed and you may experience some discomfort/pain in the lower abdomen and flank especially when urinating. You may take pain medication prescribed to you if needed for pain. You may also intermittently have blood in the urine until the stent is removed. 4. If you have a catheter, you will be taught how to take care of the catheter by the nursing staff prior to discharge from the hospital.  You may periodically feel a strong urge to void with the catheter in place.  This is a bladder spasm and most often can occur when having a bowel movement or moving around. It is typically self-limited and usually will stop after a few minutes.  You may use some Vaseline or Neosporin around the tip of the catheter to reduce friction at the tip of the penis. You may also see some blood in the urine.  A very small amount of blood can make the urine look quite red.  As long as the catheter is draining well, there usually is not a problem.  However, if the catheter is not draining well and is bloody, you should call the office [phone]) to notify us. Call your surgeon if you experience:   1.  Fever over 101.0. 2.  Inability to urinate. 3.  Nausea and/or vomiting. 4.  Problems related to your pain medication. 5 . Any problems or concerns.  Post Anesthesia Home Care Instructions  Activity: Get plenty of rest for the remainder of the day. A responsible adult should stay with you for 24 hours following the procedure.  For the next 24 hours, DO  NOT: -Drive a car -Advertising copywriter -Drink alcoholic beverages -Take any medication unless instructed by your physician -Make any legal decisions or sign important papers.  Meals: Start with liquid foods such as gelatin or soup. Progress to regular foods as tolerated. Avoid greasy, spicy, heavy foods. If nausea and/or vomiting occur, drink only clear liquids until the nausea and/or vomiting subsides. Call your physician if vomiting continues.  Special Instructions/Symptoms: Your throat may feel dry or sore from the anesthesia or the breathing tube placed in your throat during surgery. If this causes discomfort, gargle with warm salt water. The discomfort should disappear within 24 hours.  If you had a scopolamine patch placed behind your ear for the management of post- operative nausea and/or vomiting:  1. The medication in the patch is effective for 72 hours, after which it should be removed.  Wrap patch in a tissue and discard in the trash. Wash hands thoroughly with soap and water. 2. You may remove the patch earlier than 72 hours if you experience unpleasant side effects which may include dry mouth, dizziness or visual disturbances. 3. Avoid touching the patch. Wash your hands with soap and water after contact with the patch.

## 2015-04-12 NOTE — Anesthesia Preprocedure Evaluation (Signed)
Anesthesia Evaluation  Patient identified by MRN, date of birth, ID band Patient awake    Reviewed: Allergy & Precautions, NPO status , Patient's Chart, lab work & pertinent test results  Airway Mallampati: II  TM Distance: >3 FB Neck ROM: Full    Dental   Pulmonary neg pulmonary ROS,  breath sounds clear to auscultation        Cardiovascular negative cardio ROS  Rhythm:Regular Rate:Normal     Neuro/Psych negative neurological ROS     GI/Hepatic Neg liver ROS, GERD-  ,  Endo/Other  Morbid obesity  Renal/GU negative Renal ROS     Musculoskeletal  (+) Arthritis -,   Abdominal   Peds  Hematology negative hematology ROS (+)   Anesthesia Other Findings   Reproductive/Obstetrics                             Anesthesia Physical Anesthesia Plan  ASA: II  Anesthesia Plan: General   Post-op Pain Management:    Induction: Intravenous  Airway Management Planned: LMA  Additional Equipment:   Intra-op Plan:   Post-operative Plan:   Informed Consent: I have reviewed the patients History and Physical, chart, labs and discussed the procedure including the risks, benefits and alternatives for the proposed anesthesia with the patient or authorized representative who has indicated his/her understanding and acceptance.   Dental advisory given  Plan Discussed with: CRNA  Anesthesia Plan Comments:         Anesthesia Quick Evaluation

## 2015-04-12 NOTE — Anesthesia Procedure Notes (Signed)
Procedure Name: LMA Insertion Date/Time: 04/12/2015 11:42 AM Performed by: Maris Berger T Pre-anesthesia Checklist: Patient identified, Emergency Drugs available, Suction available and Patient being monitored Patient Re-evaluated:Patient Re-evaluated prior to inductionOxygen Delivery Method: Circle System Utilized Preoxygenation: Pre-oxygenation with 100% oxygen Intubation Type: IV induction Ventilation: Mask ventilation without difficulty LMA: LMA inserted LMA Size: 4.0 Number of attempts: 1 Airway Equipment and Method: Bite block Placement Confirmation: positive ETCO2 Tube secured with: Tape Dental Injury: Teeth and Oropharynx as per pre-operative assessment

## 2015-04-12 NOTE — Op Note (Signed)
Post-operative Diagnosis:  1. Meatal stenosis  Procedure and Anesthesia:  Procedure(s) and Anesthesia Type:    * CYSTO, HYDRODISTENSION, MARCAINE AND PYRIDIUM - General    * CYSTOSCOPY WITH URETHRAL  BALLOON DILATATION - General  Surgeon: Surgeon(s) and Role:    * Bjorn Loser, MD - Primary   Resident:  Star Age, MD  EBL: minimal  IVF: See anesthesia record  UOP: See anesthesia record  Drains: none  Implants: * No implants in log *  Specimens: none  Complications: * No complications entered in OR log *  Indications for Surgery: 44 y.o. female with meatal stenosis, urinary urgency as well as microscopic hematuria. She is being taken to the operating room for cystourethroscopy, balloon dilation of urethra and hydrodistention. Risks, benefits, and alternatives of the above procedure were discussed previously in detail and informed consents was signed and verified.  Findings:  1. Stenotic urethral meatus that barely accepted a 5 Fr open-ended catheter 2. Dilation to 24 Fr with 6 Fr (15 mm, 20 ATM) Cook balloon dilator with fluoroscopic assistance 3. Bladder with no obvious tumors, mucosal lesions, trabeculations or stones. Trigone with orthotopic single ureteral orifices with clear efflux noted. Bladder mucosa was hypervascular 4. No ulcerations or red dots noted following hydrodistention 5. Urethra with healthy supple tissue no evidence of stricture or scar  Procedure Details: The patient and consent was verified in the pre-op holding area and brought to the operating room where they were placed on the operating table. Pre-induction time out was called and general anesthesia was induced. SCDs were placed and IV antibiotics were started.   The patient and consent was verified in the pre-op holding area and brought to the operating room where they were placed on the operating table. Pre-induction time out was called and general anesthesia was induced. SCDs were placed and IV  antibiotics were started.   Severe meatal stenosis was encountered initially requiring balloon dilatation to 24 Fr with [facility], The balloon dilator placed over a sensor wire under fluoroscopic guidance. Balloon left in place and inflated for 1 minute. We then removed the balloon and kept the wire in place.  A 17 Fr cystoscope was inserted per urethra with ample lubrication and normal saline running alongside the wire. Findings were as noted above. We then hydrodistended the bladder to its capacity which was roughly 600 mL. No red dots or ulcerations noted.  As a separate procedure I instilled 15 mL of 0.5% Marcaine and 400 mg of peridium via a 16 french foley catheter which I then removed. At this point we terminated the procedure. Patient was taken to PACU in satisfactory condition without any obvious immediate post-surgical complications.    Teaching Physician Attestation: Dr. Matilde Sprang was present and scrubbed for the entirety of the procedure  Pietro Cassis. Ottis Stain, MD Resident [facility] Department of Urologic Surgery/[facility]

## 2015-04-13 ENCOUNTER — Encounter (HOSPITAL_BASED_OUTPATIENT_CLINIC_OR_DEPARTMENT_OTHER): Payer: Self-pay | Admitting: Urology

## 2015-04-13 NOTE — Anesthesia Postprocedure Evaluation (Signed)
  Anesthesia Post-op Note  Patient: Dana Salazar  Procedure(s) Performed: Procedure(s): CYSTO, HYDRODISTENSION, MARCAINE AND PYRIDIUM (N/A) CYSTOSCOPY WITH URETHRAL  BALLOON DILATATION (N/A)  Patient Location: PACU  Anesthesia Type:General  Level of Consciousness: awake and alert   Airway and Oxygen Therapy: Patient Spontanous Breathing  Post-op Pain: none  Post-op Assessment: Post-op Vital signs reviewed              Post-op Vital Signs: Reviewed  Last Vitals:  Filed Vitals:   04/12/15 1315  BP: 121/65  Pulse: 52  Temp: 36.4 C  Resp: 16    Complications: No apparent anesthesia complications

## 2015-04-18 ENCOUNTER — Ambulatory Visit (INDEPENDENT_AMBULATORY_CARE_PROVIDER_SITE_OTHER): Payer: Commercial Managed Care - HMO | Admitting: Neurology

## 2015-04-18 ENCOUNTER — Encounter: Payer: Self-pay | Admitting: Neurology

## 2015-04-18 ENCOUNTER — Ambulatory Visit (INDEPENDENT_AMBULATORY_CARE_PROVIDER_SITE_OTHER): Payer: Self-pay | Admitting: Neurology

## 2015-04-18 DIAGNOSIS — R202 Paresthesia of skin: Secondary | ICD-10-CM

## 2015-04-18 DIAGNOSIS — R209 Unspecified disturbances of skin sensation: Secondary | ICD-10-CM

## 2015-04-18 NOTE — Progress Notes (Signed)
Please refer to EMG and nerve conduction study procedure note. 

## 2015-04-18 NOTE — Procedures (Signed)
     HISTORY:   Dana Salazar is a 44 year old patient with a history of migratory paresthesias involving all 4 extremities. The patient has a history of prior carpal tunnel syndrome, status post surgery. She is being evaluated for the sensory alterations.  NERVE CONDUCTION STUDIES:  Nerve conduction studies were performed on both upper extremities. The distal motor latencies and motor amplitudes for the median and ulnar nerves were within normal limits. The F wave latencies and nerve conduction velocities for these nerves were also normal. The sensory latencies for the median and ulnar nerves were normal.  Nerve conduction studies were performed on both lower extremities. The distal motor latencies and motor amplitudes for the peroneal and posterior tibial nerves were within normal limits. The nerve conduction velocities for these nerves were also normal. The H reflex latencies were normal. The sensory latencies for the peroneal nerves were within normal limits.   EMG STUDIES:  EMG study was performed on the right lower extremity:  The tibialis anterior muscle reveals 2 to 4K motor units with full recruitment. No fibrillations or positive waves were seen. The peroneus tertius muscle reveals 2 to 4K motor units with full recruitment. No fibrillations or positive waves were seen. The medial gastrocnemius muscle reveals 1 to 3K motor units with full recruitment. No fibrillations or positive waves were seen. The vastus lateralis muscle reveals 2 to 4K motor units with full recruitment. No fibrillations or positive waves were seen. The iliopsoas muscle reveals 2 to 4K motor units with full recruitment. No fibrillations or positive waves were seen. The biceps femoris muscle (long head) reveals 2 to 4K motor units with full recruitment. No fibrillations or positive waves were seen. The lumbosacral paraspinal muscles were tested at 3 levels, and revealed no abnormalities of insertional activity at  all 3 levels tested, with exception of one run of positive waves seen in the upper level. There was good relaxation.   IMPRESSION:  Nerve conduction studies done on all 4 extremities were unremarkable, no evidence of a peripheral neuropathy is seen. EMG evaluation of the right lower extremity was unremarkable, without evidence of an overlying lumbar radiculopathy.  Marlan Palau MD 04/18/2015 11:31 AM  [facility] [address] [place], [place] [address]  Phone [phone] Fax [phone]

## 2015-04-27 DIAGNOSIS — N3020 Other chronic cystitis without hematuria: Secondary | ICD-10-CM | POA: Diagnosis not present

## 2015-04-27 DIAGNOSIS — R350 Frequency of micturition: Secondary | ICD-10-CM | POA: Diagnosis not present

## 2015-05-11 DIAGNOSIS — H538 Other visual disturbances: Secondary | ICD-10-CM | POA: Diagnosis not present

## 2015-05-11 DIAGNOSIS — J0140 Acute pansinusitis, unspecified: Secondary | ICD-10-CM | POA: Diagnosis not present

## 2015-05-11 DIAGNOSIS — R7309 Other abnormal glucose: Secondary | ICD-10-CM | POA: Diagnosis not present

## 2015-05-11 DIAGNOSIS — R42 Dizziness and giddiness: Secondary | ICD-10-CM | POA: Diagnosis not present

## 2015-05-27 DIAGNOSIS — J019 Acute sinusitis, unspecified: Secondary | ICD-10-CM | POA: Diagnosis not present

## 2015-05-27 DIAGNOSIS — R42 Dizziness and giddiness: Secondary | ICD-10-CM | POA: Diagnosis not present

## 2015-06-21 DIAGNOSIS — N898 Other specified noninflammatory disorders of vagina: Secondary | ICD-10-CM | POA: Diagnosis not present

## 2015-06-21 DIAGNOSIS — M25561 Pain in right knee: Secondary | ICD-10-CM | POA: Diagnosis not present

## 2015-06-21 DIAGNOSIS — M25562 Pain in left knee: Secondary | ICD-10-CM | POA: Diagnosis not present

## 2015-06-21 DIAGNOSIS — R350 Frequency of micturition: Secondary | ICD-10-CM | POA: Diagnosis not present

## 2015-06-21 DIAGNOSIS — R319 Hematuria, unspecified: Secondary | ICD-10-CM | POA: Diagnosis not present

## 2015-06-28 DIAGNOSIS — M25562 Pain in left knee: Secondary | ICD-10-CM | POA: Diagnosis not present

## 2015-06-28 DIAGNOSIS — M25561 Pain in right knee: Secondary | ICD-10-CM | POA: Diagnosis not present

## 2015-07-11 ENCOUNTER — Other Ambulatory Visit: Payer: Self-pay

## 2015-07-11 DIAGNOSIS — F4321 Adjustment disorder with depressed mood: Secondary | ICD-10-CM | POA: Diagnosis not present

## 2015-07-11 DIAGNOSIS — Z1231 Encounter for screening mammogram for malignant neoplasm of breast: Secondary | ICD-10-CM

## 2015-07-11 DIAGNOSIS — G4700 Insomnia, unspecified: Secondary | ICD-10-CM | POA: Diagnosis not present

## 2015-07-27 DIAGNOSIS — J019 Acute sinusitis, unspecified: Secondary | ICD-10-CM | POA: Diagnosis not present

## 2015-07-27 DIAGNOSIS — F419 Anxiety disorder, unspecified: Secondary | ICD-10-CM | POA: Diagnosis not present

## 2015-08-22 ENCOUNTER — Ambulatory Visit: Payer: Self-pay

## 2015-09-07 ENCOUNTER — Ambulatory Visit: Payer: Self-pay

## 2015-09-08 ENCOUNTER — Ambulatory Visit: Payer: Self-pay

## 2015-09-22 DIAGNOSIS — M79642 Pain in left hand: Secondary | ICD-10-CM | POA: Diagnosis not present

## 2015-09-22 DIAGNOSIS — R350 Frequency of micturition: Secondary | ICD-10-CM | POA: Diagnosis not present

## 2015-09-22 DIAGNOSIS — M79641 Pain in right hand: Secondary | ICD-10-CM | POA: Diagnosis not present

## 2015-09-22 DIAGNOSIS — N3000 Acute cystitis without hematuria: Secondary | ICD-10-CM | POA: Diagnosis not present

## 2015-09-22 DIAGNOSIS — M7702 Medial epicondylitis, left elbow: Secondary | ICD-10-CM | POA: Diagnosis not present

## 2015-09-29 ENCOUNTER — Ambulatory Visit: Payer: Self-pay

## 2015-10-07 ENCOUNTER — Ambulatory Visit
Admission: RE | Admit: 2015-10-07 | Discharge: 2015-10-07 | Disposition: A | Discharge status: Home / Self Care | Payer: Commercial Managed Care - HMO | Source: Ambulatory Visit

## 2015-10-07 DIAGNOSIS — Z1231 Encounter for screening mammogram for malignant neoplasm of breast: Secondary | ICD-10-CM | POA: Diagnosis not present

## 2015-10-11 ENCOUNTER — Other Ambulatory Visit: Payer: Self-pay | Admitting: Family Medicine

## 2015-10-11 DIAGNOSIS — R928 Other abnormal and inconclusive findings on diagnostic imaging of breast: Secondary | ICD-10-CM

## 2015-10-14 ENCOUNTER — Other Ambulatory Visit: Payer: Self-pay

## 2015-10-17 ENCOUNTER — Ambulatory Visit
Admission: RE | Admit: 2015-10-17 | Discharge: 2015-10-17 | Disposition: A | Discharge status: Home / Self Care | Payer: Commercial Managed Care - HMO | Source: Ambulatory Visit | Attending: Family Medicine | Admitting: Family Medicine

## 2015-10-17 DIAGNOSIS — R928 Other abnormal and inconclusive findings on diagnostic imaging of breast: Secondary | ICD-10-CM

## 2015-11-23 DIAGNOSIS — R7309 Other abnormal glucose: Secondary | ICD-10-CM | POA: Diagnosis not present

## 2015-11-23 DIAGNOSIS — R350 Frequency of micturition: Secondary | ICD-10-CM | POA: Diagnosis not present

## 2015-11-23 DIAGNOSIS — R300 Dysuria: Secondary | ICD-10-CM | POA: Diagnosis not present

## 2015-11-23 DIAGNOSIS — J019 Acute sinusitis, unspecified: Secondary | ICD-10-CM | POA: Diagnosis not present

## 2015-12-28 DIAGNOSIS — J019 Acute sinusitis, unspecified: Secondary | ICD-10-CM | POA: Diagnosis not present

## 2015-12-28 DIAGNOSIS — B9689 Other specified bacterial agents as the cause of diseases classified elsewhere: Secondary | ICD-10-CM | POA: Diagnosis not present

## 2015-12-28 DIAGNOSIS — R351 Nocturia: Secondary | ICD-10-CM | POA: Diagnosis not present

## 2015-12-28 DIAGNOSIS — R300 Dysuria: Secondary | ICD-10-CM | POA: Diagnosis not present

## 2016-01-09 DIAGNOSIS — R8290 Unspecified abnormal findings in urine: Secondary | ICD-10-CM | POA: Diagnosis not present

## 2016-01-09 DIAGNOSIS — R7309 Other abnormal glucose: Secondary | ICD-10-CM | POA: Diagnosis not present

## 2016-01-09 DIAGNOSIS — E785 Hyperlipidemia, unspecified: Secondary | ICD-10-CM | POA: Diagnosis not present

## 2016-01-09 DIAGNOSIS — N3281 Overactive bladder: Secondary | ICD-10-CM | POA: Diagnosis not present

## 2016-01-09 DIAGNOSIS — Z0000 Encounter for general adult medical examination without abnormal findings: Secondary | ICD-10-CM | POA: Diagnosis not present

## 2016-01-09 DIAGNOSIS — Z113 Encounter for screening for infections with a predominantly sexual mode of transmission: Secondary | ICD-10-CM | POA: Diagnosis not present

## 2016-04-18 DIAGNOSIS — M79641 Pain in right hand: Secondary | ICD-10-CM | POA: Diagnosis not present

## 2016-04-18 DIAGNOSIS — M79642 Pain in left hand: Secondary | ICD-10-CM | POA: Diagnosis not present

## 2016-04-24 DIAGNOSIS — H6981 Other specified disorders of Eustachian tube, right ear: Secondary | ICD-10-CM | POA: Diagnosis not present

## 2016-04-24 DIAGNOSIS — J019 Acute sinusitis, unspecified: Secondary | ICD-10-CM | POA: Diagnosis not present

## 2016-04-24 DIAGNOSIS — R300 Dysuria: Secondary | ICD-10-CM | POA: Diagnosis not present

## 2016-04-24 DIAGNOSIS — M79643 Pain in unspecified hand: Secondary | ICD-10-CM | POA: Diagnosis not present

## 2016-04-24 DIAGNOSIS — N390 Urinary tract infection, site not specified: Secondary | ICD-10-CM | POA: Diagnosis not present

## 2016-06-21 DIAGNOSIS — J329 Chronic sinusitis, unspecified: Secondary | ICD-10-CM | POA: Diagnosis not present

## 2016-06-25 DIAGNOSIS — Z131 Encounter for screening for diabetes mellitus: Secondary | ICD-10-CM | POA: Diagnosis not present

## 2016-06-25 DIAGNOSIS — N3001 Acute cystitis with hematuria: Secondary | ICD-10-CM | POA: Diagnosis not present

## 2016-06-25 DIAGNOSIS — Z8719 Personal history of other diseases of the digestive system: Secondary | ICD-10-CM | POA: Diagnosis not present

## 2016-06-25 DIAGNOSIS — R399 Unspecified symptoms and signs involving the genitourinary system: Secondary | ICD-10-CM | POA: Diagnosis not present

## 2016-06-25 DIAGNOSIS — R350 Frequency of micturition: Secondary | ICD-10-CM | POA: Diagnosis not present

## 2016-07-10 DIAGNOSIS — R109 Unspecified abdominal pain: Secondary | ICD-10-CM | POA: Diagnosis not present

## 2016-07-10 DIAGNOSIS — R197 Diarrhea, unspecified: Secondary | ICD-10-CM | POA: Diagnosis not present

## 2016-07-10 DIAGNOSIS — K219 Gastro-esophageal reflux disease without esophagitis: Secondary | ICD-10-CM | POA: Diagnosis not present

## 2016-07-10 DIAGNOSIS — R140 Abdominal distension (gaseous): Secondary | ICD-10-CM | POA: Diagnosis not present

## 2016-07-13 DIAGNOSIS — R197 Diarrhea, unspecified: Secondary | ICD-10-CM | POA: Diagnosis not present

## 2016-07-13 DIAGNOSIS — R350 Frequency of micturition: Secondary | ICD-10-CM | POA: Diagnosis not present

## 2016-09-11 ENCOUNTER — Other Ambulatory Visit: Payer: Self-pay | Admitting: Family Medicine

## 2016-09-11 DIAGNOSIS — Z1231 Encounter for screening mammogram for malignant neoplasm of breast: Secondary | ICD-10-CM

## 2016-10-08 ENCOUNTER — Ambulatory Visit: Payer: Self-pay

## 2016-10-08 DIAGNOSIS — K219 Gastro-esophageal reflux disease without esophagitis: Secondary | ICD-10-CM | POA: Diagnosis not present

## 2016-10-08 DIAGNOSIS — R202 Paresthesia of skin: Secondary | ICD-10-CM | POA: Diagnosis not present

## 2016-10-08 DIAGNOSIS — R739 Hyperglycemia, unspecified: Secondary | ICD-10-CM | POA: Diagnosis not present

## 2016-10-08 DIAGNOSIS — R51 Headache: Secondary | ICD-10-CM | POA: Diagnosis not present

## 2016-10-08 DIAGNOSIS — M791 Myalgia: Secondary | ICD-10-CM | POA: Diagnosis not present

## 2016-10-08 DIAGNOSIS — E049 Nontoxic goiter, unspecified: Secondary | ICD-10-CM | POA: Diagnosis not present

## 2016-10-08 DIAGNOSIS — R5383 Other fatigue: Secondary | ICD-10-CM | POA: Diagnosis not present

## 2016-10-15 DIAGNOSIS — E119 Type 2 diabetes mellitus without complications: Secondary | ICD-10-CM | POA: Diagnosis not present

## 2016-10-15 DIAGNOSIS — R5383 Other fatigue: Secondary | ICD-10-CM | POA: Diagnosis not present

## 2016-10-15 DIAGNOSIS — R350 Frequency of micturition: Secondary | ICD-10-CM | POA: Diagnosis not present

## 2016-10-15 DIAGNOSIS — E049 Nontoxic goiter, unspecified: Secondary | ICD-10-CM | POA: Diagnosis not present

## 2016-10-15 DIAGNOSIS — F329 Major depressive disorder, single episode, unspecified: Secondary | ICD-10-CM | POA: Diagnosis not present

## 2016-10-15 DIAGNOSIS — R748 Abnormal levels of other serum enzymes: Secondary | ICD-10-CM | POA: Diagnosis not present

## 2016-10-15 DIAGNOSIS — R52 Pain, unspecified: Secondary | ICD-10-CM | POA: Diagnosis not present

## 2016-10-17 ENCOUNTER — Other Ambulatory Visit: Payer: Self-pay | Admitting: Family Medicine

## 2016-10-17 DIAGNOSIS — E049 Nontoxic goiter, unspecified: Secondary | ICD-10-CM

## 2016-10-24 ENCOUNTER — Ambulatory Visit
Admission: RE | Admit: 2016-10-24 | Discharge: 2016-10-24 | Disposition: A | Discharge status: Home / Self Care | Payer: Commercial Managed Care - HMO | Source: Ambulatory Visit | Attending: Family Medicine | Admitting: Family Medicine

## 2016-10-24 DIAGNOSIS — Z1231 Encounter for screening mammogram for malignant neoplasm of breast: Secondary | ICD-10-CM | POA: Diagnosis not present

## 2016-10-24 DIAGNOSIS — E049 Nontoxic goiter, unspecified: Secondary | ICD-10-CM

## 2016-10-31 DIAGNOSIS — F329 Major depressive disorder, single episode, unspecified: Secondary | ICD-10-CM | POA: Diagnosis not present

## 2016-10-31 DIAGNOSIS — E069 Thyroiditis, unspecified: Secondary | ICD-10-CM | POA: Diagnosis not present

## 2016-11-07 DIAGNOSIS — R768 Other specified abnormal immunological findings in serum: Secondary | ICD-10-CM | POA: Diagnosis not present

## 2016-11-07 DIAGNOSIS — Z6836 Body mass index (BMI) 36.0-36.9, adult: Secondary | ICD-10-CM | POA: Diagnosis not present

## 2016-11-07 DIAGNOSIS — M7989 Other specified soft tissue disorders: Secondary | ICD-10-CM | POA: Diagnosis not present

## 2016-11-07 DIAGNOSIS — D8989 Other specified disorders involving the immune mechanism, not elsewhere classified: Secondary | ICD-10-CM | POA: Diagnosis not present

## 2016-11-07 DIAGNOSIS — E669 Obesity, unspecified: Secondary | ICD-10-CM | POA: Diagnosis not present

## 2016-11-07 DIAGNOSIS — M2550 Pain in unspecified joint: Secondary | ICD-10-CM | POA: Diagnosis not present

## 2016-11-21 DIAGNOSIS — E7800 Pure hypercholesterolemia, unspecified: Secondary | ICD-10-CM | POA: Diagnosis not present

## 2016-11-21 DIAGNOSIS — R5383 Other fatigue: Secondary | ICD-10-CM | POA: Diagnosis not present

## 2016-11-21 DIAGNOSIS — H538 Other visual disturbances: Secondary | ICD-10-CM | POA: Diagnosis not present

## 2016-11-21 DIAGNOSIS — F329 Major depressive disorder, single episode, unspecified: Secondary | ICD-10-CM | POA: Diagnosis not present

## 2016-11-21 DIAGNOSIS — K219 Gastro-esophageal reflux disease without esophagitis: Secondary | ICD-10-CM | POA: Diagnosis not present

## 2016-11-21 DIAGNOSIS — R7303 Prediabetes: Secondary | ICD-10-CM | POA: Diagnosis not present

## 2016-11-21 DIAGNOSIS — M542 Cervicalgia: Secondary | ICD-10-CM | POA: Diagnosis not present

## 2016-11-21 DIAGNOSIS — G4700 Insomnia, unspecified: Secondary | ICD-10-CM | POA: Diagnosis not present

## 2016-11-22 ENCOUNTER — Encounter: Payer: Commercial Managed Care - HMO | Attending: Family | Admitting: Nutrition

## 2016-11-22 VITALS — Ht 63.0 in | Wt 204.0 lb

## 2016-11-22 DIAGNOSIS — E669 Obesity, unspecified: Secondary | ICD-10-CM

## 2016-11-22 DIAGNOSIS — R739 Hyperglycemia, unspecified: Secondary | ICD-10-CM

## 2016-11-22 NOTE — Progress Notes (Signed)
  Medical Nutrition Therapy:  Appt start time: 1100 end time:  1200.  Assessment:  Primary concerns today: Obesity and prediabetes. A1C 6.3%. Lives with her mom. Has   acanthosis nigricans around her neck. PMH: Panic attacks, Hyperlipidemia, Obesty, GERD, IBS.  She admits to signs of hyperglycemia of fatigue, increased urination, blurry vision and mild depression. Willing to go back and see a therapist for her depression, anxiety and emotional issues.  Family history of DM. Risks for Dm is obesity, HTN, Hyperlipidemia. Eats 1 meal per day. Doesn't eat balanced meals. Eats fast foods. Says she isn't hungry. Not exercising. On disability. She admits she knows what to do but is in denial to make changes in her diet and exercise to improve her health. She feels like her depression is limiting her ability to take better care of herself.  Willing to see a therapist. Diet is excessive to meet her needs and low in fresh fruits, vegetables and high fiber foods .  Preferred Learning Style:    No preference indicated   Learning Readiness:  Ready  Change in progress   MEDICATIONS:    DIETARY INTAKE:  24-hr recall:  B ( AM): skipped  Snk ( AM):  L ( PM): salmon and loaded baked potato, sweet tea  Snk ( PM):  D ( PM): skipped Snk ( PM): Beverages: Sweet tea 2-3 glasses per day.  Usual physical activity:   Estimated energy needs: 1500  calories 170 g carbohydrates 112 g protein 44 g fat  Progress Towards Goal(s):  In progress.   Nutritional Diagnosis:  NB-1.1 Food and nutrition-related knowledge deficit As related to Obesity and prediabets.  As evidenced by A1C 6.3% and BMI > 30.    Intervention:  Nutrition and Diabetes education provided on My Plate, CHO counting, meal planning, portion sizes, timing of meals, avoiding snacks between meals signs/symptoms and treatment of hyper/hypoglycemia, benefits of exercising 30 minutes per day and treatment of prediabetes, weight loss tips and  avoiding processed foods. Low Sodium High Fiber Low Fat Diet . Goals 1. Follow My Plate 2. Increase fresh fruits and vegetables 3. Drink water only- cut out tea and sodas 4. Eat meals on time as discussed. 5. Do not skip meals. 6. Exercised 30-60 minutes per day 7. Eat 2-3 carb choices per meal 8. Lose 1-2 lbs per week Talk to MD about starting Metformin 500 mg twice a day.   Teaching Method Utilized:  Visual Auditory Hands on  Handouts given during visit include:  The Plate Method  Meal Plan Card  Barriers to learning/adherence to lifestyle change: depressed  Demonstrated degree of understanding via:  Teach Back   Monitoring/Evaluation:  Dietary intake, exercise, meal planning, and body weight in 1 month(s). She in wanting to be referred to see a counselor. May also benefit from an evaluation for depression and possible low VIT D levels and contributing to depression. Would recommend to start Metformin 500 mg BID.

## 2016-11-22 NOTE — Patient Instructions (Signed)
Goals 1. Follow My Plate 2. Increase fresh fruits and vegetables 3. Drink water only- cut out tea and sodas 4. Eat meals on time as discussed. 5. Do not skip meals. 6. Exercised 30-60 minutes per day 7. Eat 2-3 carb choices per meal 8. Lose 1-2 lbs per week Talk to MD about starting Metformin 500 mg twice a day.

## 2016-12-23 DIAGNOSIS — F331 Major depressive disorder, recurrent, moderate: Secondary | ICD-10-CM | POA: Diagnosis not present

## 2016-12-24 ENCOUNTER — Ambulatory Visit: Payer: Self-pay | Admitting: Nutrition

## 2016-12-25 DIAGNOSIS — R21 Rash and other nonspecific skin eruption: Secondary | ICD-10-CM | POA: Diagnosis not present

## 2016-12-25 DIAGNOSIS — G4700 Insomnia, unspecified: Secondary | ICD-10-CM | POA: Diagnosis not present

## 2016-12-31 DIAGNOSIS — E069 Thyroiditis, unspecified: Secondary | ICD-10-CM | POA: Diagnosis not present

## 2016-12-31 DIAGNOSIS — R197 Diarrhea, unspecified: Secondary | ICD-10-CM | POA: Diagnosis not present

## 2016-12-31 DIAGNOSIS — R21 Rash and other nonspecific skin eruption: Secondary | ICD-10-CM | POA: Diagnosis not present

## 2016-12-31 DIAGNOSIS — E669 Obesity, unspecified: Secondary | ICD-10-CM | POA: Diagnosis not present

## 2016-12-31 DIAGNOSIS — R7309 Other abnormal glucose: Secondary | ICD-10-CM | POA: Diagnosis not present

## 2016-12-31 DIAGNOSIS — Z6835 Body mass index (BMI) 35.0-35.9, adult: Secondary | ICD-10-CM | POA: Diagnosis not present

## 2016-12-31 DIAGNOSIS — L83 Acanthosis nigricans: Secondary | ICD-10-CM | POA: Diagnosis not present

## 2017-01-11 DIAGNOSIS — G4700 Insomnia, unspecified: Secondary | ICD-10-CM | POA: Diagnosis not present

## 2017-01-11 DIAGNOSIS — F329 Major depressive disorder, single episode, unspecified: Secondary | ICD-10-CM | POA: Diagnosis not present

## 2017-01-11 DIAGNOSIS — Z79899 Other long term (current) drug therapy: Secondary | ICD-10-CM | POA: Diagnosis not present

## 2017-01-11 DIAGNOSIS — E785 Hyperlipidemia, unspecified: Secondary | ICD-10-CM | POA: Diagnosis not present

## 2017-01-11 DIAGNOSIS — G43909 Migraine, unspecified, not intractable, without status migrainosus: Secondary | ICD-10-CM | POA: Diagnosis not present

## 2017-01-11 DIAGNOSIS — G5600 Carpal tunnel syndrome, unspecified upper limb: Secondary | ICD-10-CM | POA: Diagnosis not present

## 2017-01-11 DIAGNOSIS — R21 Rash and other nonspecific skin eruption: Secondary | ICD-10-CM | POA: Diagnosis not present

## 2017-01-11 DIAGNOSIS — K219 Gastro-esophageal reflux disease without esophagitis: Secondary | ICD-10-CM | POA: Diagnosis not present

## 2017-01-11 DIAGNOSIS — F419 Anxiety disorder, unspecified: Secondary | ICD-10-CM | POA: Diagnosis not present

## 2017-02-18 DIAGNOSIS — R350 Frequency of micturition: Secondary | ICD-10-CM | POA: Diagnosis not present

## 2017-02-18 DIAGNOSIS — N390 Urinary tract infection, site not specified: Secondary | ICD-10-CM | POA: Diagnosis not present

## 2017-02-18 DIAGNOSIS — N898 Other specified noninflammatory disorders of vagina: Secondary | ICD-10-CM | POA: Diagnosis not present

## 2017-03-09 DIAGNOSIS — J0140 Acute pansinusitis, unspecified: Secondary | ICD-10-CM | POA: Diagnosis not present

## 2017-10-07 ENCOUNTER — Other Ambulatory Visit: Payer: Self-pay | Admitting: Family Medicine

## 2017-10-07 DIAGNOSIS — Z1231 Encounter for screening mammogram for malignant neoplasm of breast: Secondary | ICD-10-CM

## 2017-10-29 ENCOUNTER — Ambulatory Visit: Payer: Self-pay

## 2017-11-20 ENCOUNTER — Ambulatory Visit
Admission: RE | Admit: 2017-11-20 | Discharge: 2017-11-20 | Disposition: A | Discharge status: Home / Self Care | Payer: Medicare HMO | Source: Ambulatory Visit | Attending: Family Medicine | Admitting: Family Medicine

## 2017-11-20 DIAGNOSIS — Z1231 Encounter for screening mammogram for malignant neoplasm of breast: Secondary | ICD-10-CM

## 2018-09-23 ENCOUNTER — Other Ambulatory Visit: Payer: Self-pay | Admitting: Family Medicine

## 2018-09-23 DIAGNOSIS — Z1231 Encounter for screening mammogram for malignant neoplasm of breast: Secondary | ICD-10-CM

## 2018-11-26 ENCOUNTER — Ambulatory Visit: Payer: Self-pay

## 2018-12-24 ENCOUNTER — Ambulatory Visit: Payer: Self-pay

## 2019-01-10 IMAGING — US US THYROID
1 series · 14 of 25 positions shown · non-contrast
Comparison: None.

CLINICAL DATA: 45-year-old female with a history of thyroid goiter

EXAM:
THYROID ULTRASOUND
TECHNIQUE: Ultrasound examination of the thyroid gland and adjacent soft
tissues was performed.

[Series 1: us thyroid · 0.05mm/px · 14 of 37 slices shown]
[im 1/37]
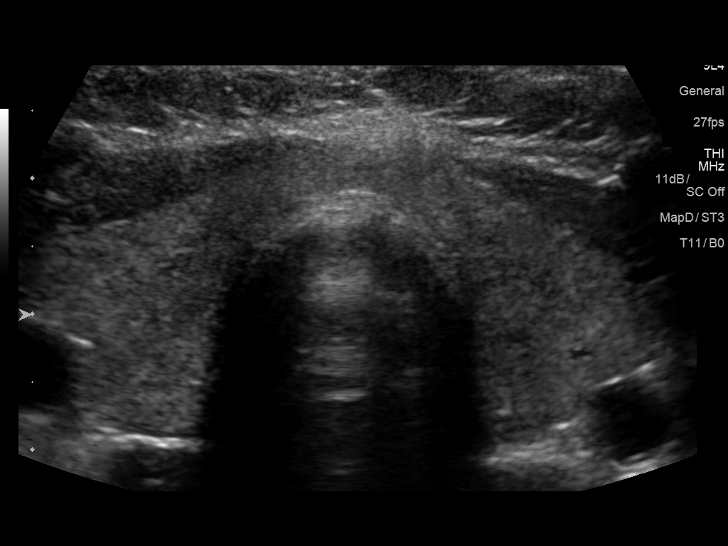
[im 4/37]
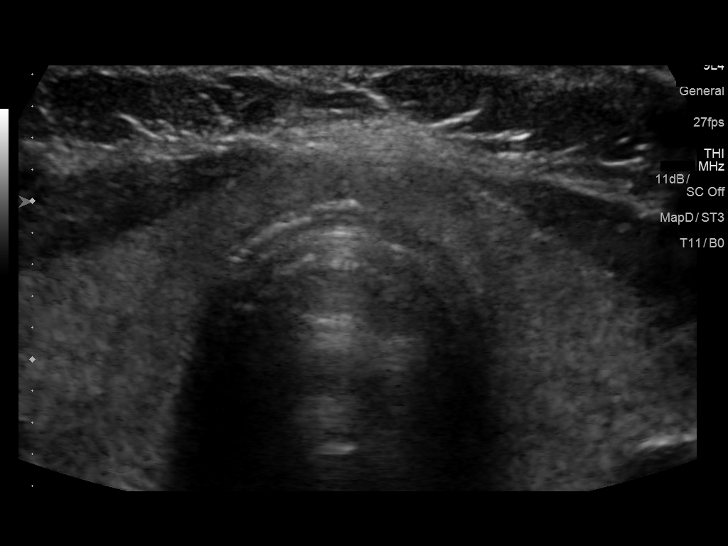
[im 7/37]
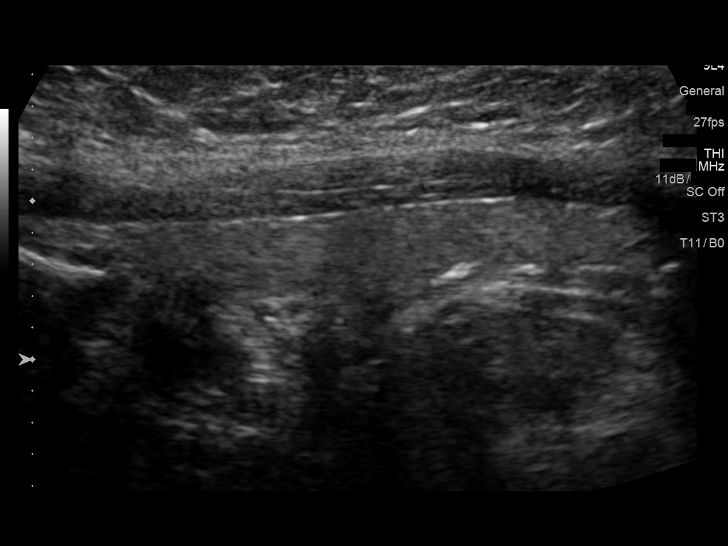
[im 10/37]
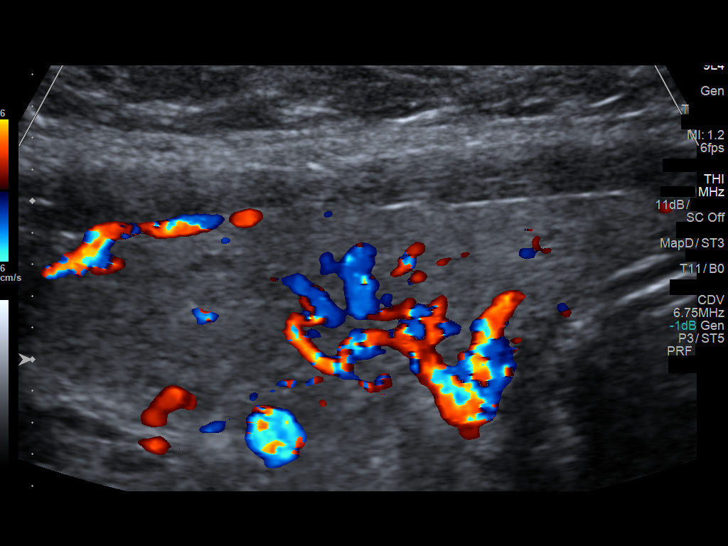
[im 13/37]
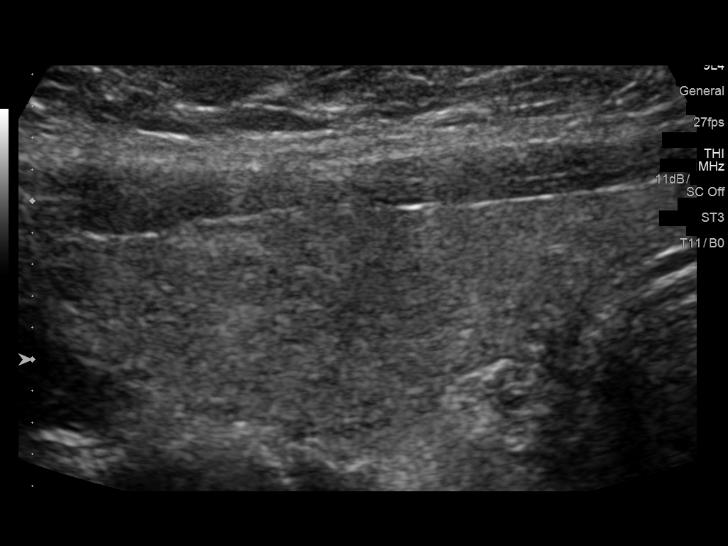
[im 14/37]
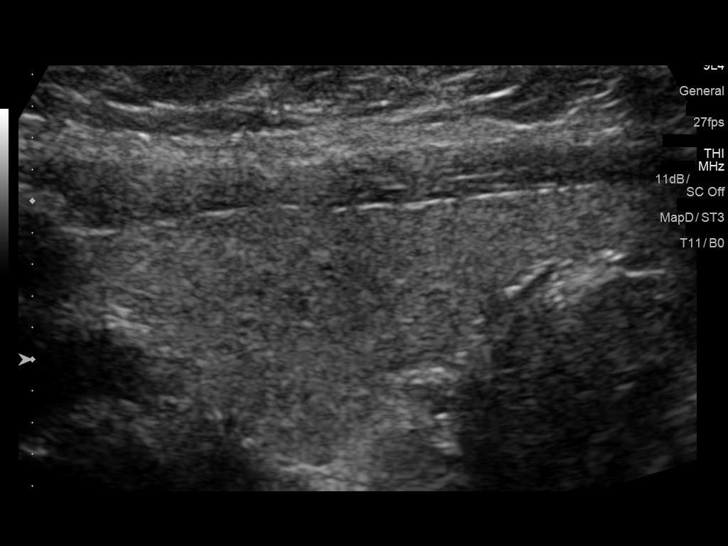
[im 17/37]
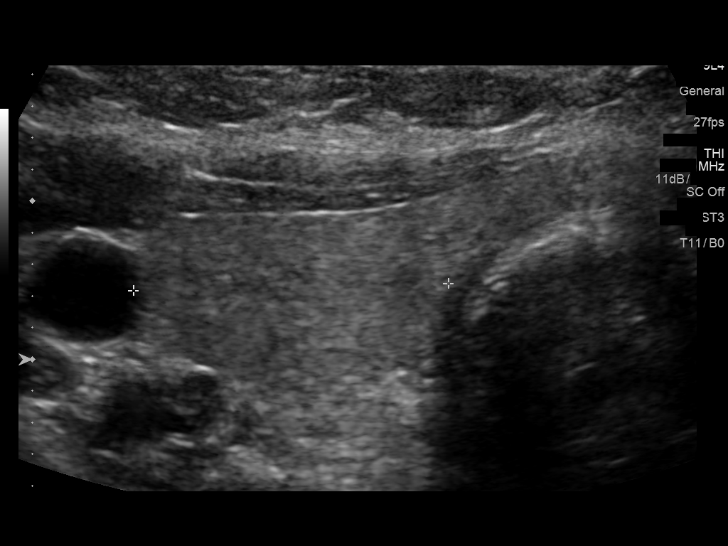
[im 20/37]
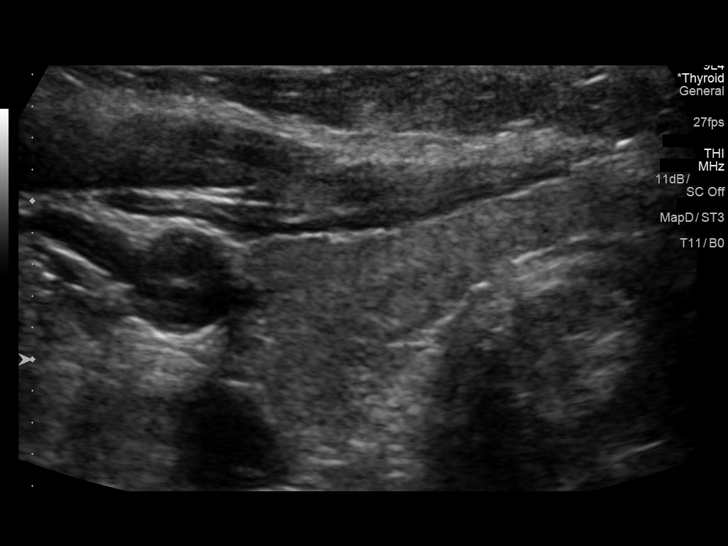
[im 23/37]
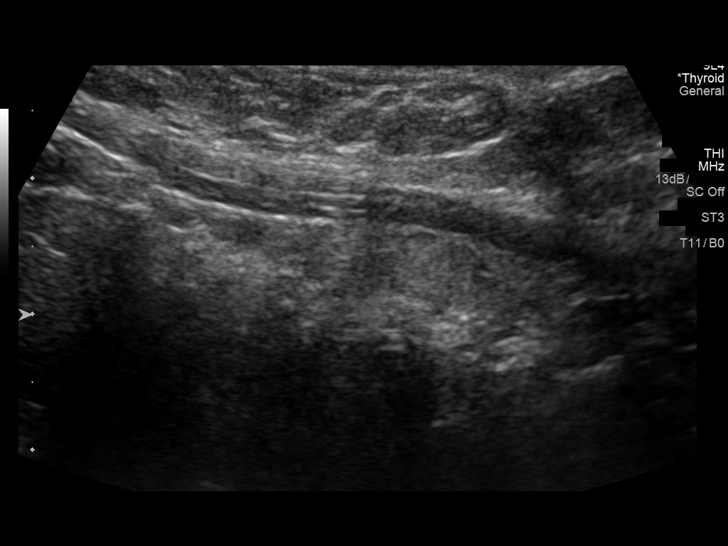
[im 25/37]
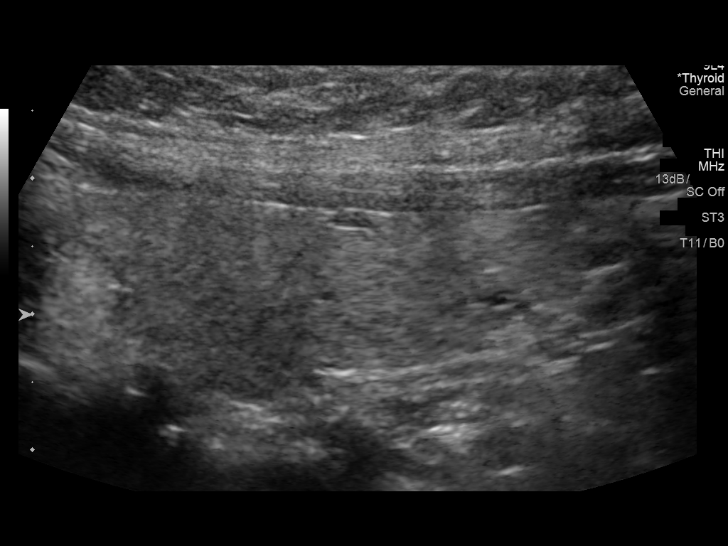
[im 28/37]
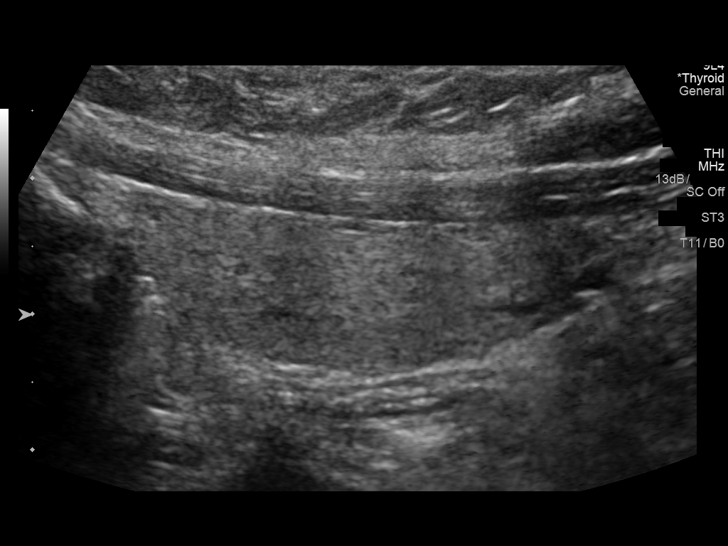
[im 31/37]
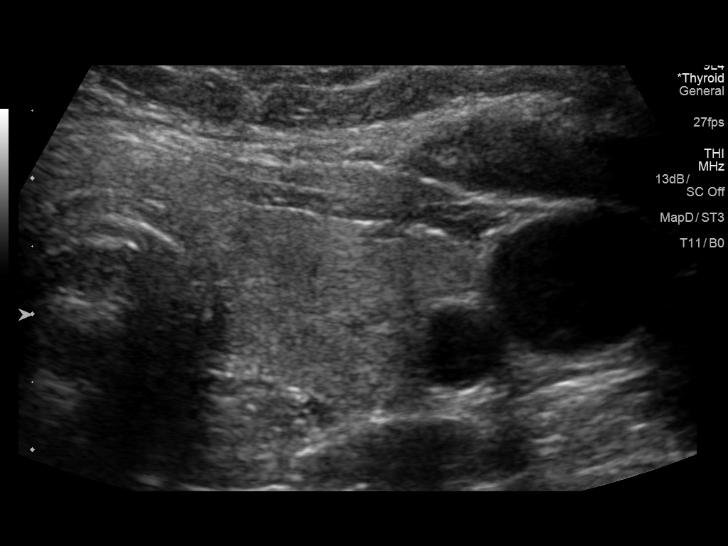
[im 34/37]
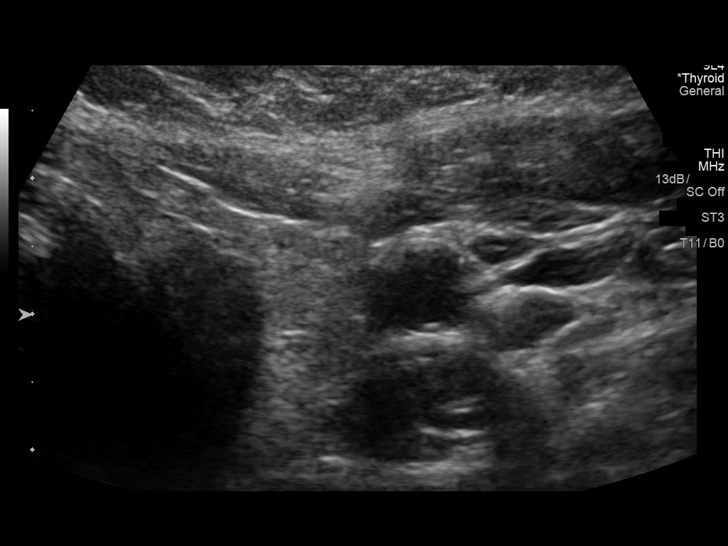
[im 37/37]
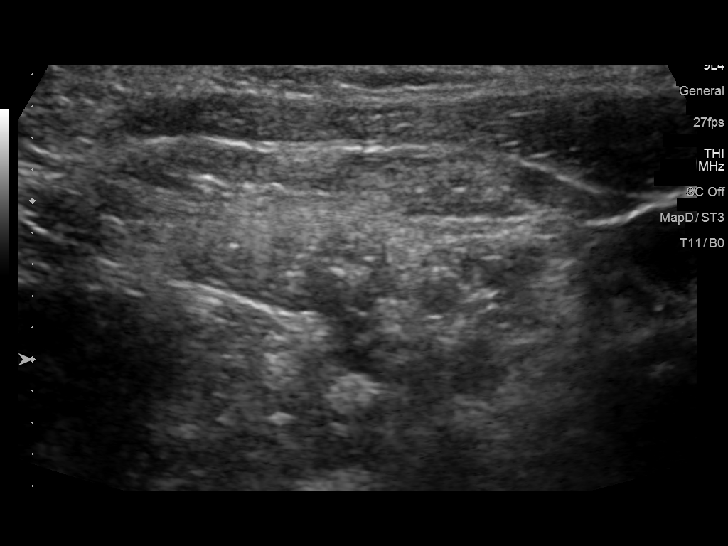

[14 of 25 positions shown; findings below may reference images not displayed]

FINDINGS: Parenchymal Echotexture: Mildly heterogenous

Isthmus: 0.3 cm

Right lobe: 4.5 cm x 1.7 cm x 1.9 cm

Left lobe: 4.2 cm x 1.4 cm x 1.9 cm

_________________________________________________________

Estimated total number of nodules >/= 1 cm: 0

Number of spongiform nodules >/=  2 cm not described below (TR1): 0

Number of mixed cystic and solid nodules >/= 1.5 cm not described
below (TR2): 0

No discrete nodules are seen within the thyroid gland.
IMPRESSION: Heterogeneous appearance of the thyroid, potentially representing
medical thyroid disease.

## 2019-02-10 ENCOUNTER — Ambulatory Visit
Admission: RE | Admit: 2019-02-10 | Discharge: 2019-02-10 | Disposition: A | Discharge status: Home / Self Care | Payer: Medicare HMO | Source: Ambulatory Visit | Attending: Family Medicine | Admitting: Family Medicine

## 2019-02-10 ENCOUNTER — Other Ambulatory Visit: Payer: Self-pay

## 2019-02-10 DIAGNOSIS — Z1231 Encounter for screening mammogram for malignant neoplasm of breast: Secondary | ICD-10-CM

## 2019-02-13 ENCOUNTER — Ambulatory Visit: Payer: Self-pay

## 2020-01-07 ENCOUNTER — Other Ambulatory Visit: Payer: Self-pay | Admitting: Family Medicine

## 2020-01-07 DIAGNOSIS — Z1231 Encounter for screening mammogram for malignant neoplasm of breast: Secondary | ICD-10-CM

## 2020-02-11 ENCOUNTER — Ambulatory Visit: Payer: Medicare HMO

## 2020-02-17 ENCOUNTER — Other Ambulatory Visit: Payer: Self-pay

## 2020-02-17 ENCOUNTER — Ambulatory Visit
Admission: RE | Admit: 2020-02-17 | Discharge: 2020-02-17 | Disposition: A | Discharge status: Home / Self Care | Payer: Medicare HMO | Source: Ambulatory Visit | Attending: Family Medicine | Admitting: Family Medicine

## 2020-02-17 DIAGNOSIS — Z1231 Encounter for screening mammogram for malignant neoplasm of breast: Secondary | ICD-10-CM

## 2020-02-19 ENCOUNTER — Other Ambulatory Visit: Payer: Self-pay | Admitting: Family Medicine

## 2020-02-19 DIAGNOSIS — R928 Other abnormal and inconclusive findings on diagnostic imaging of breast: Secondary | ICD-10-CM

## 2020-03-02 ENCOUNTER — Other Ambulatory Visit: Payer: Medicare HMO

## 2020-03-04 ENCOUNTER — Ambulatory Visit
Admission: RE | Admit: 2020-03-04 | Discharge: 2020-03-04 | Disposition: A | Discharge status: Home / Self Care | Payer: Medicare HMO | Source: Ambulatory Visit | Attending: Family Medicine | Admitting: Family Medicine

## 2020-03-04 ENCOUNTER — Other Ambulatory Visit: Payer: Self-pay

## 2020-03-04 DIAGNOSIS — R928 Other abnormal and inconclusive findings on diagnostic imaging of breast: Secondary | ICD-10-CM

## 2020-12-26 ENCOUNTER — Other Ambulatory Visit: Payer: Self-pay | Admitting: Family Medicine

## 2020-12-26 DIAGNOSIS — Z1231 Encounter for screening mammogram for malignant neoplasm of breast: Secondary | ICD-10-CM

## 2021-01-23 ENCOUNTER — Ambulatory Visit: Payer: Medicare HMO | Admitting: Neurology

## 2021-03-07 ENCOUNTER — Ambulatory Visit: Payer: Medicare HMO

## 2021-04-17 ENCOUNTER — Ambulatory Visit: Payer: Medicare HMO

## 2021-04-26 ENCOUNTER — Encounter: Payer: Self-pay | Admitting: *Deleted

## 2021-05-01 ENCOUNTER — Institutional Professional Consult (permissible substitution): Payer: Medicare HMO | Admitting: Neurology

## 2021-06-07 ENCOUNTER — Other Ambulatory Visit: Payer: Self-pay

## 2021-06-07 ENCOUNTER — Ambulatory Visit
Admission: RE | Admit: 2021-06-07 | Discharge: 2021-06-07 | Disposition: A | Discharge status: Home / Self Care | Payer: Medicare HMO | Source: Ambulatory Visit | Attending: Family Medicine | Admitting: Family Medicine

## 2021-06-07 DIAGNOSIS — Z1231 Encounter for screening mammogram for malignant neoplasm of breast: Secondary | ICD-10-CM

## 2022-01-09 ENCOUNTER — Other Ambulatory Visit: Payer: Self-pay | Admitting: Family Medicine

## 2022-01-09 DIAGNOSIS — Z1231 Encounter for screening mammogram for malignant neoplasm of breast: Secondary | ICD-10-CM

## 2022-06-08 ENCOUNTER — Ambulatory Visit
Admission: RE | Admit: 2022-06-08 | Discharge: 2022-06-08 | Disposition: A | Discharge status: Home / Self Care | Payer: Medicare HMO | Source: Ambulatory Visit | Attending: Family Medicine | Admitting: Family Medicine

## 2022-06-08 DIAGNOSIS — Z1231 Encounter for screening mammogram for malignant neoplasm of breast: Secondary | ICD-10-CM

## 2023-05-07 ENCOUNTER — Other Ambulatory Visit: Payer: Self-pay | Admitting: Family Medicine

## 2023-05-07 DIAGNOSIS — Z1231 Encounter for screening mammogram for malignant neoplasm of breast: Secondary | ICD-10-CM

## 2023-06-14 ENCOUNTER — Ambulatory Visit: Payer: Medicare HMO

## 2023-07-05 ENCOUNTER — Ambulatory Visit: Payer: Medicare HMO

## 2023-07-12 ENCOUNTER — Ambulatory Visit: Payer: Medicare HMO

## 2023-08-14 ENCOUNTER — Ambulatory Visit
Admission: RE | Admit: 2023-08-14 | Discharge: 2023-08-14 | Disposition: A | Discharge status: Home / Self Care | Payer: Medicare HMO | Source: Ambulatory Visit | Attending: Family Medicine | Admitting: Family Medicine

## 2023-08-14 DIAGNOSIS — Z1231 Encounter for screening mammogram for malignant neoplasm of breast: Secondary | ICD-10-CM

## 2024-02-10 ENCOUNTER — Ambulatory Visit: Admitting: Dermatology

## 2024-07-02 ENCOUNTER — Other Ambulatory Visit: Payer: Self-pay | Admitting: Registered Nurse

## 2024-07-02 DIAGNOSIS — Z1231 Encounter for screening mammogram for malignant neoplasm of breast: Secondary | ICD-10-CM

## 2024-08-14 ENCOUNTER — Ambulatory Visit

## 2024-09-11 ENCOUNTER — Ambulatory Visit
Admission: RE | Admit: 2024-09-11 | Discharge: 2024-09-11 | Disposition: A | Source: Ambulatory Visit | Attending: Registered Nurse | Admitting: Registered Nurse

## 2024-09-11 DIAGNOSIS — Z1231 Encounter for screening mammogram for malignant neoplasm of breast: Secondary | ICD-10-CM
